# Patient Record
Sex: Male | Born: 1965
Health system: Southern US, Community
[De-identification: ages and names within clinical notes are randomized; demographics above are authoritative.]

## PROBLEM LIST (undated history)

## (undated) DIAGNOSIS — R7303 Prediabetes: Secondary | ICD-10-CM

## (undated) DIAGNOSIS — I10 Essential (primary) hypertension: Secondary | ICD-10-CM

## (undated) DIAGNOSIS — K219 Gastro-esophageal reflux disease without esophagitis: Secondary | ICD-10-CM

## (undated) DIAGNOSIS — R079 Chest pain, unspecified: Secondary | ICD-10-CM

## (undated) DIAGNOSIS — E785 Hyperlipidemia, unspecified: Secondary | ICD-10-CM

## (undated) DIAGNOSIS — E119 Type 2 diabetes mellitus without complications: Secondary | ICD-10-CM

## (undated) DIAGNOSIS — G4730 Sleep apnea, unspecified: Secondary | ICD-10-CM

## (undated) HISTORY — DX: Prediabetes: R73.03

## (undated) HISTORY — PX: FOOT SURGERY: SHX648

## (undated) HISTORY — PX: HERNIA REPAIR: SHX51

## (undated) HISTORY — PX: LIPOMA EXCISION: SHX5283

## (undated) HISTORY — PX: UMBILICAL HERNIA REPAIR: SHX196

## (undated) HISTORY — DX: Chest pain, unspecified: R07.9

## (undated) HISTORY — PX: CORRECTION HAMMER TOE: SUR317

---

## 1999-08-11 ENCOUNTER — Ambulatory Visit (HOSPITAL_COMMUNITY): Admission: RE | Admit: 1999-08-11 | Discharge: 1999-08-11 | Payer: Self-pay | Admitting: Emergency Medicine

## 2001-08-28 ENCOUNTER — Encounter: Admission: RE | Admit: 2001-08-28 | Discharge: 2001-08-28 | Payer: Self-pay | Admitting: Emergency Medicine

## 2001-08-28 ENCOUNTER — Encounter: Payer: Self-pay | Admitting: Emergency Medicine

## 2003-06-02 ENCOUNTER — Encounter: Admission: RE | Admit: 2003-06-02 | Discharge: 2003-06-02 | Payer: Self-pay | Admitting: Emergency Medicine

## 2003-07-22 ENCOUNTER — Ambulatory Visit (HOSPITAL_BASED_OUTPATIENT_CLINIC_OR_DEPARTMENT_OTHER): Admission: RE | Admit: 2003-07-22 | Discharge: 2003-07-22 | Payer: Self-pay

## 2003-07-22 ENCOUNTER — Ambulatory Visit (HOSPITAL_COMMUNITY): Admission: RE | Admit: 2003-07-22 | Discharge: 2003-07-22 | Payer: Self-pay

## 2003-10-22 ENCOUNTER — Emergency Department (HOSPITAL_COMMUNITY): Admission: EM | Admit: 2003-10-22 | Discharge: 2003-10-22 | Payer: Self-pay | Admitting: Family Medicine

## 2004-04-05 ENCOUNTER — Encounter: Admission: RE | Admit: 2004-04-05 | Discharge: 2004-04-05 | Payer: Self-pay | Admitting: Emergency Medicine

## 2005-06-05 ENCOUNTER — Encounter: Admission: RE | Admit: 2005-06-05 | Discharge: 2005-09-03 | Payer: Self-pay | Admitting: Emergency Medicine

## 2006-02-23 ENCOUNTER — Ambulatory Visit (HOSPITAL_COMMUNITY): Admission: RE | Admit: 2006-02-23 | Discharge: 2006-02-23 | Payer: Self-pay | Admitting: Cardiovascular Disease

## 2006-02-26 ENCOUNTER — Ambulatory Visit (HOSPITAL_COMMUNITY): Admission: RE | Admit: 2006-02-26 | Discharge: 2006-02-26 | Payer: Self-pay | Admitting: Cardiovascular Disease

## 2007-05-11 ENCOUNTER — Ambulatory Visit (HOSPITAL_COMMUNITY): Admission: RE | Admit: 2007-05-11 | Discharge: 2007-05-11 | Payer: Self-pay | Admitting: Emergency Medicine

## 2007-08-11 ENCOUNTER — Emergency Department (HOSPITAL_COMMUNITY): Admission: EM | Admit: 2007-08-11 | Discharge: 2007-08-11 | Payer: Self-pay | Admitting: Emergency Medicine

## 2007-09-13 ENCOUNTER — Emergency Department (HOSPITAL_COMMUNITY): Admission: EM | Admit: 2007-09-13 | Discharge: 2007-09-13 | Payer: Self-pay | Admitting: Emergency Medicine

## 2008-02-05 ENCOUNTER — Ambulatory Visit (HOSPITAL_COMMUNITY): Admission: RE | Admit: 2008-02-05 | Discharge: 2008-02-05 | Payer: Self-pay | Admitting: Emergency Medicine

## 2008-03-03 ENCOUNTER — Encounter: Admission: RE | Admit: 2008-03-03 | Discharge: 2008-04-09 | Payer: Self-pay | Admitting: Emergency Medicine

## 2008-03-10 ENCOUNTER — Ambulatory Visit: Payer: Self-pay | Admitting: Sports Medicine

## 2008-03-10 DIAGNOSIS — M2170 Unequal limb length (acquired), unspecified site: Secondary | ICD-10-CM | POA: Insufficient documentation

## 2008-03-10 DIAGNOSIS — M1711 Unilateral primary osteoarthritis, right knee: Secondary | ICD-10-CM | POA: Insufficient documentation

## 2008-03-10 DIAGNOSIS — M1710 Unilateral primary osteoarthritis, unspecified knee: Secondary | ICD-10-CM | POA: Insufficient documentation

## 2008-03-10 DIAGNOSIS — M25569 Pain in unspecified knee: Secondary | ICD-10-CM | POA: Insufficient documentation

## 2008-03-10 DIAGNOSIS — IMO0002 Reserved for concepts with insufficient information to code with codable children: Secondary | ICD-10-CM | POA: Insufficient documentation

## 2008-04-14 ENCOUNTER — Ambulatory Visit: Payer: Self-pay | Admitting: Sports Medicine

## 2008-05-01 DIAGNOSIS — G4730 Sleep apnea, unspecified: Secondary | ICD-10-CM

## 2008-05-01 HISTORY — PX: CARDIAC CATHETERIZATION: SHX172

## 2008-05-01 HISTORY — DX: Sleep apnea, unspecified: G47.30

## 2009-02-10 ENCOUNTER — Ambulatory Visit: Payer: Self-pay | Admitting: Sports Medicine

## 2009-02-10 DIAGNOSIS — M216X9 Other acquired deformities of unspecified foot: Secondary | ICD-10-CM | POA: Insufficient documentation

## 2009-02-10 DIAGNOSIS — M25579 Pain in unspecified ankle and joints of unspecified foot: Secondary | ICD-10-CM | POA: Insufficient documentation

## 2009-08-05 ENCOUNTER — Ambulatory Visit: Payer: Self-pay | Admitting: Sports Medicine

## 2009-08-05 DIAGNOSIS — M25529 Pain in unspecified elbow: Secondary | ICD-10-CM | POA: Insufficient documentation

## 2009-08-05 DIAGNOSIS — M25819 Other specified joint disorders, unspecified shoulder: Secondary | ICD-10-CM | POA: Insufficient documentation

## 2009-08-05 DIAGNOSIS — M7580 Other shoulder lesions, unspecified shoulder: Secondary | ICD-10-CM

## 2009-08-05 DIAGNOSIS — M7710 Lateral epicondylitis, unspecified elbow: Secondary | ICD-10-CM | POA: Insufficient documentation

## 2009-09-22 ENCOUNTER — Ambulatory Visit: Payer: Self-pay | Admitting: Sports Medicine

## 2009-10-07 ENCOUNTER — Encounter: Payer: Self-pay | Admitting: Sports Medicine

## 2009-10-07 ENCOUNTER — Encounter: Admission: RE | Admit: 2009-10-07 | Discharge: 2009-12-09 | Payer: Self-pay | Admitting: Sports Medicine

## 2009-11-06 ENCOUNTER — Encounter: Payer: Self-pay | Admitting: Sports Medicine

## 2009-12-28 ENCOUNTER — Ambulatory Visit (HOSPITAL_COMMUNITY): Admission: RE | Admit: 2009-12-28 | Discharge: 2009-12-28 | Payer: Self-pay | Admitting: Family Medicine

## 2010-05-22 ENCOUNTER — Encounter: Payer: Self-pay | Admitting: Emergency Medicine

## 2010-06-02 NOTE — Assessment & Plan Note (Signed)
Summary: F/U Kerry Nelson   Vital Signs:  Patient profile:   45 year old male BP sitting:   149 / 103  Vitals Entered By: Lillia Pauls CMA (Sep 22, 2009 9:37 AM)  History of Present Illness: injection done and helped for 2-3 weeks still has decrease shoulder pain compared to before hurts with lifting above head at times has only done some of exercises and not consistently  left elbow is much better strap helped a lot can'Kerry tell much pain with any of his activity  Physical Exam  General:  Well-developed,well-nourished,in no acute distress; alert,appropriate and cooperative throughout examination Msk:  full abductiona and elevation with left shoulder full rotation and motion of elbow on left  left shoulder internal rotation causes anterior pain  external rotation at waist level no pain  internal rotation waist level no pain  supraspinadus testing no pain at side  deltoid testing no pain  back scratch slightly tight on left - 1 vetebrae level weak on empty can  pain on hawkins  neg drop arm good strength thorughout except w impingement positions   Impression & Recommendations:  Problem # 1:  SHOULDER IMPINGEMENT SYNDROME (ICD-726.2) I think he needs a good course of PT as he is inconsistent with HEP  making progress but hopefully we can speed this  add scap stab exercises  refer to PT  ck p PT complete  Problem # 2:  LATERAL EPICONDYLITIS (ICD-726.32) much better  do wrist curls and rolls for 2 more mos  cont strap for 2 more mos  Problem # 3:  ELBOW PAIN, LEFT (ICD-719.42) Assessment: Improved This is resolved almost 100% w strap use  Complete Medication List: 1)  Simvastatin 40 Mg Tabs (Simvastatin) .Marland Kitchen.. 1 by mouth qhs 2)  Hyzaar 50-12.5 Mg Tabs (Losartan potassium-hctz) .Marland Kitchen.. 1 by mouth qd 3)  Nexium 40 Mg Cpdr (Esomeprazole magnesium) .Marland Kitchen.. 1 by mouth qhs 4)  Tramadol Hcl 50 Mg Tabs (Tramadol hcl) .Marland Kitchen.. 1 by mouth q6hrs

## 2010-06-02 NOTE — Letter (Signed)
Summary: [facility] PT referral form  [facility] PT referral form   Imported By: Marily Memos 09/22/2009 11:00:17  _____________________________________________________________________  External Attachment:    Type:   Image     Comment:   External Document

## 2010-06-02 NOTE — Assessment & Plan Note (Signed)
Summary: L SHOULDER/ELBOW PAIN,[facility]   Vital Signs:  Patient profile:   45 year old male BP sitting:   135 / 89  Vitals Entered By: Lillia Pauls CMA (August 05, 2009 3:55 PM)  History of Present Illness: Pt presents with left shoulder and elbow pain for the last 3 weeks since suppint dryer duct around his pipes under his home. He is right handed. He is now having lateral elbow pain but also occasionally gets medial pain with full elbow extension. In addition, his left shoulder has been painful for him as well, especially when he tries to do overhead activities, put his hand behind his back or lifting a gallon of milk. The pain can wake him up at night from sleep. He denies any numbness or tingling. The pain is focused in the joint region but he also has tightness in his deltoid and trapezius. He has not tried any medications, ice or heat for his symptoms. Only prior injury was in the early 2000s when he fell through the rafters at [employer] when he was employed there.   Physical Exam  General:  alert and well-developed.   Head:  normocephalic and atraumatic.   Neck:  supple.   Lungs:  normal respiratory effort.   Msk:  Left Shoulder: No bony abnormalities, edema or bruising + TTP over the biceps tendon Full forward flexion and abduction but pain with both movements between 90-110 degrees 5/5 strength with resisted internal and external rotation Neg Empty can  + Hawkin's, + Cross over, + Neer's, + Speed's  5/5 biceps, triceps and grip strength Can get his hand behind his back to L3 Neg Yergason's  Right Shoulder: Normal inspection without bony abnormalities, edema or bruising Full ROM without pain with forward flexion and abduction No TTP throughout Neg Neer's, Hawkin's, Cross over, Empty can and Speed's 5/5 strength with resisted internal and external rotation, 5/5 strength with resisted biceps, triceps and grip strength testing  Left Elbow: Normal inspection Full ROM in all  directions + TTP over the lateral epicondyle, mild TTP over the medial epicondyle Neg tap test over the ulnar groove 5/5 strength with all resisted ROM testing but pain with resisted pronation and wrist extension Pain with resisted 3rd finger extension  Right Elbow: Normal inspection Full ROM in all directions No TTP throughout Neg tap test over the ulnar groove 5/5 strength with all resisted ROM testing No pain with resisted wrist extension      Impression & Recommendations:  Problem # 1:  ELBOW PAIN, LEFT (ICD-719.42) Assessment New Due to lateral epicondylitis and overuse 1. Given a conterforce brace to wear at all times to help with vibration and overuse 2. Given a handout with multiple stretches and strengthening exercises to do daily 3. Ice for 20 minutes daily 4. Return in 4 weeks for follow-up  Orders: Pneumatic Arm Band (Z6109) Kenalog 10 mg inj (J3301)  Problem # 2:  LATERAL EPICONDYLITIS (ICD-726.32) Assessment: New See above for treatment plan  Problem # 3:  SHOULDER IMPINGEMENT SYNDROME (ICD-726.2) Assessment: New  1. Patient consented to have an injection performed for impingement syndrome Consent obtained and verified. Sterile betadine prep. Furthur cleansed with alcohol. Topical analgesic spray: Ethyl chloride. Joint:Left shoulder Approached in typical fashion: posterior shoulder Completed without difficulty Meds: 40mg  kenalog, 6 cc of 0.25% marcaine Needle:21 guage, 2 inch Aftercare instructions and Red flags advised. Patient tolerated the procedure well. No stressful activities with the left shoulder for 72 hours  2. Given a handout and  theraband to do home exercises with on a daily basis 3. Return in 4 weeks for follow-up  Orders: Joint Aspirate / Injection, Large (20610)  Complete Medication List: 1)  Simvastatin 40 Mg Tabs (Simvastatin) .Marland Kitchen.. 1 by mouth qhs 2)  Hyzaar 50-12.5 Mg Tabs (Losartan potassium-hctz) .Marland Kitchen.. 1 by mouth qd 3)   Nexium 40 Mg Cpdr (Esomeprazole magnesium) .Marland Kitchen.. 1 by mouth qhs 4)  Tramadol Hcl 50 Mg Tabs (Tramadol hcl) .Marland Kitchen.. 1 by mouth q6hrs  Patient Instructions: 1)  Please do the elbow stretches and exercises daily along with your shoulder exercises with the theraband. 2)  Avoid strenuous activities for the next 3 days with your left shoulder. 3)  Please come back in 4 weeks for follow-up. 4)  Wear the tennis elbow brace during the day.

## 2010-06-02 NOTE — Miscellaneous (Signed)
Summary: [facility]  [facility]   Imported By: Marily Memos 11/17/2009 10:16:42  _____________________________________________________________________  External Attachment:    Type:   Image     Comment:   External Document

## 2010-06-02 NOTE — Miscellaneous (Signed)
Summary: [facility]  [facility]   Imported By: Marily Memos 12/09/2009 14:31:26  _____________________________________________________________________  External Attachment:    Type:   Image     Comment:   External Document

## 2010-09-16 NOTE — Op Note (Signed)
NAME:  Kerry Nelson, Kerry Nelson                           ACCOUNT NO.:  [id number]   MEDICAL RECORD NO.:  [id number]                   PATIENT TYPE:  AMB   LOCATION:  DSC                                  FACILITY:  [facility]   PHYSICIAN:  Lorre Munroe., M.D.            DATE OF BIRTH:  12-10-1965   DATE OF PROCEDURE:  07/22/2003  DATE OF DISCHARGE:                                 OPERATIVE REPORT   PREOPERATIVE DIAGNOSES:  Umbilical hernia.   POSTOPERATIVE DIAGNOSES:  Umbilical hernia.   OPERATION:  Repair of umbilical hernia.   SURGEON:  Lebron Conners, M.D.   ANESTHESIA:  General and local.   DESCRIPTION OF PROCEDURE:  After the patient was monitored and anesthetized  and had routine preparation and draping of the abdomen, I made a downwardly  curved incision approximately 4 cm in length just above the umbilicus. I  dissected down through the normal subcutaneous tissues until I encountered  the contents of a hernia coming through the umbilical area and adherent to  the umbilical skin. I separated the contents of the hernia from the  surrounding normal subcutaneous tissues and from the umbilical skin and then  freed it up at the level of the fascia and reduced it under the fascia. It  appeared to be entirely preperitoneal fat.  I then mobilized the umbilical  skin and subcutaneous tissues for approximately 2 cm in all directions from  the hernia defect which measured approximately 1 1/2 cm.  I then fashioned a  small patch of polypropylene mesh and spread it out in the preperitoneal  space just underneath the fascia.  I then closed the hernia defect with  running 2-0 Prolene suture incorporating the mesh into the closure.  I felt  that the hernia was adequately repaired. There had been a small area of  abnormal appearing fat which I excised and explored down to the fascia to  make sure no epigastric hernia was present.  Hemostasis was good. I sutured  the umbilical skin down to the fascia  with a single stitch of 4-0 Vicryl to  reduce dead space. I then closed skin with intracuticular running 4-0 Vicryl  and Steri-Strips and applied a bulky bandage.  The patient was stable  throughout. I anesthetized the area with long acting local anesthetic prior  to closure. I applied a bulky slightly compressive bandage.                                               Lorre Munroe., M.D.    Jodi Marble  D:  07/22/2003  T:  07/23/2003  Job:  [id number]

## 2010-09-16 NOTE — Cardiovascular Report (Signed)
NAMEKAYNAN, KLONOWSKI NO.:  [id number]   MEDICAL RECORD NO.:  [id number]          PATIENT TYPE:  OIB   LOCATION:  2899                         FACILITY:  [facility]   PHYSICIAN:  Nanetta Batty, M.D.   DATE OF BIRTH:  1966/02/09   DATE OF PROCEDURE:  02/26/2006  DATE OF DISCHARGE:  02/26/2006                              CARDIAC CATHETERIZATION   Mr. Kerry Nelson was a 45 year old mildly overweight African American male with  positive risk factors, atypical chest pain and positive Myoview who presents  now for outpatient diagnostic coronary angiography to define his anatomy and  rule out ischemic etiology.   DESCRIPTION OF PROCEDURE:  The patient was brought to the second floor [facility] in the postabsorptive state.  His  right groin was prepped and shaved in the usual sterile fashion; 1%  Xylocaine was used for local anesthesia.  A 6-French sheath was inserted  into the right femoral artery using standard Seldinger technique.  A 6-  French right and left Judkins diagnostic catheters as well as [place] pigtail  catheter were used for selective coronary angiography and left  ventriculography, respectively.  Visipaque dye was used for the entirety of  the case.  Aortic, left ventricular, and left and right heart pressures were  recorded.   HEMODYNAMIC RESULTS:  Aortic systolic pressure 143, diastolic pressure 90,  left ventricular systolic pressure 141, end-diastolic pressure 26.   SELECTIVE CORONARY ANGIOGRAPHY:  1. Left main: There was no true left main.  There was a double-barrel      left main with separate ostium of the LAD and circumflex.  2. LAD: Widely patent.  3. Circumflex: Widely patent.  4. Right coronary:  was dominant and widely patent.   LEFT VENTRICULOGRAPHY:  RAO left ventriculogram was performed using 25 mL of  Visipaque dye at 12 mL per second.  The overall  LVEF was estimated greater  than 60% without focal wall  motion abnormalities.   IMPRESSION:  Mr. Kerry Nelson has essentially normal coronary arteries with double-  barrel left main and normal LV function.  I believe his chest pain is  noncardiac.  It is likely related to reflux or multifactorial.  Continued  medical therapy will be recommended including treatment of his sleep apnea  and hypertension.   Sheath was removed and pressure held on the groin to achieve hemostasis.  The patient left the lab in stable condition.Marland Kitchen  He will be discharged home  and followed as an outpatient. Will see back in the office in approximately  a week for follow-up.      Nanetta Batty, M.D.  Electronically Signed     JB/MEDQ  D:  02/26/2006  T:  02/26/2006  Job:  [id number]   cc:   2nd Floor [facility]  [facility]  [facility]  Reuben Likes, M.D.

## 2010-10-17 ENCOUNTER — Encounter: Payer: 59 | Attending: Physician Assistant

## 2010-11-08 ENCOUNTER — Encounter: Payer: 59 | Attending: Physician Assistant

## 2010-11-09 NOTE — Progress Notes (Signed)
  Patient was seen on 11/08/2010 for the second of a series of three diabetes self-management courses at [facility]. The following learning objectives were met by the patient during this course:   States the relationship of exercise to blood glucose  States benefits/barriers of regular and safe exercise  States three guidelines for safe and effective exercise  Describes personal diabetes medicine regimen  Describes actions of own medications  Describes causes, symptoms, and treatment of hypo/hyperglycemia  Describes sick day rules  Identifies when to test urine for ketones when appropriate  Identifies when to call healthcare provider for acute complications  States the risk for problems with foot, skin, and dental care  States preventative foot, skin, and dental care measures  States when to call healthcare provider regarding foot, skin, and dental care  Identifies methods for evaluation of diabetes control  Discusses benefits of SBGM  Identifies relationship between nutrition, exercise, medication, and glucose levels  Discusses the importance of record keeping  Follow-Up Plan: Patient will attend the final class of the ADA Diabetes Self-Care Education.  

## 2010-11-21 NOTE — Progress Notes (Signed)
  Patient was seen on 11/15/2010 for the third of a series of three diabetes self-management courses at the [facility]. The following learning objectives were met by the patient during this course:   Identifies nutrient effects on glycemia  States the general guidelines of meal planning  Relates understanding of personal meal plan  Describes situations that cause stress and discuss methods of stress management  Identifies lifestyle behaviors for change  Your patient has established the following 3 month goal for diabetes self-care:  Eat meals on time.  Follow-Up Plan: Patient will attend a 3 month follow-up visit for diabetes self-management education.

## 2012-07-18 ENCOUNTER — Other Ambulatory Visit (HOSPITAL_COMMUNITY): Payer: Self-pay | Admitting: Orthopaedic Surgery

## 2012-07-18 DIAGNOSIS — M25512 Pain in left shoulder: Secondary | ICD-10-CM

## 2012-07-22 ENCOUNTER — Ambulatory Visit (HOSPITAL_COMMUNITY): Payer: 59

## 2012-07-23 ENCOUNTER — Ambulatory Visit (HOSPITAL_COMMUNITY): Admission: RE | Admit: 2012-07-23 | Payer: 59 | Source: Ambulatory Visit

## 2012-07-24 ENCOUNTER — Ambulatory Visit (HOSPITAL_COMMUNITY)
Admission: RE | Admit: 2012-07-24 | Discharge: 2012-07-24 | Disposition: A | Discharge status: Home / Self Care | Payer: 59 | Source: Ambulatory Visit | Attending: Orthopaedic Surgery | Admitting: Orthopaedic Surgery

## 2012-07-24 DIAGNOSIS — M19019 Primary osteoarthritis, unspecified shoulder: Secondary | ICD-10-CM | POA: Insufficient documentation

## 2012-07-24 DIAGNOSIS — Y93B3 Activity, free weights: Secondary | ICD-10-CM | POA: Insufficient documentation

## 2012-07-24 DIAGNOSIS — M25519 Pain in unspecified shoulder: Secondary | ICD-10-CM | POA: Insufficient documentation

## 2012-07-24 DIAGNOSIS — M25419 Effusion, unspecified shoulder: Secondary | ICD-10-CM | POA: Insufficient documentation

## 2012-07-24 DIAGNOSIS — X58XXXA Exposure to other specified factors, initial encounter: Secondary | ICD-10-CM | POA: Insufficient documentation

## 2012-07-24 DIAGNOSIS — M25512 Pain in left shoulder: Secondary | ICD-10-CM

## 2012-09-09 ENCOUNTER — Encounter (HOSPITAL_COMMUNITY): Payer: Self-pay | Admitting: Pharmacy Technician

## 2012-09-10 ENCOUNTER — Telehealth: Payer: Self-pay | Admitting: Cardiovascular Disease

## 2012-09-10 ENCOUNTER — Encounter (HOSPITAL_COMMUNITY)
Admission: RE | Admit: 2012-09-10 | Discharge: 2012-09-10 | Disposition: A | Discharge status: Home / Self Care | Payer: 59 | Source: Ambulatory Visit | Attending: Orthopaedic Surgery | Admitting: Orthopaedic Surgery

## 2012-09-10 ENCOUNTER — Encounter (HOSPITAL_COMMUNITY)
Admission: RE | Admit: 2012-09-10 | Discharge: 2012-09-10 | Disposition: A | Discharge status: Home / Self Care | Payer: 59 | Source: Ambulatory Visit | Attending: Anesthesiology | Admitting: Anesthesiology

## 2012-09-10 ENCOUNTER — Encounter (HOSPITAL_COMMUNITY): Payer: Self-pay

## 2012-09-10 HISTORY — DX: Hyperlipidemia, unspecified: E78.5

## 2012-09-10 HISTORY — DX: Sleep apnea, unspecified: G47.30

## 2012-09-10 HISTORY — DX: Gastro-esophageal reflux disease without esophagitis: K21.9

## 2012-09-10 HISTORY — DX: Essential (primary) hypertension: I10

## 2012-09-10 LAB — BASIC METABOLIC PANEL
BUN: 10 mg/dL (ref 6–23)
CO2: 29 mEq/L (ref 19–32)
Creatinine, Ser: 1.05 mg/dL (ref 0.50–1.35)
GFR calc Af Amer: >90 mL/min (ref >90)
Glucose, Bld: 99 mg/dL (ref 70–99)
Sodium: 139 mEq/L (ref 135–145)

## 2012-09-10 LAB — CBC
HCT: 39.7 % (ref 39.0–52.0)
Hemoglobin: 13.3 g/dL (ref 13.0–17.0)
RBC: 4.61 MIL/uL (ref 4.22–5.81)
WBC: 8.6 10*3/uL (ref 4.0–10.5)

## 2012-09-10 LAB — SURGICAL PCR SCREEN: MRSA, PCR: NEGATIVE

## 2012-09-10 NOTE — Telephone Encounter (Signed)
Call from [facility] requesting a stress test and sleep study for the patient.  The patient has not been seen here since 2008, so the chart would have to be requested from [facility].  I faxed this information to [facility] at [phone].

## 2012-09-10 NOTE — Progress Notes (Signed)
No orders for PAT appt . Called office for orders.

## 2012-09-10 NOTE — Pre-Procedure Instructions (Signed)
Kerry Nelson  09/10/2012   Your procedure is scheduled on:  Tuesday, May 20  Report to [facility] at 0530 AM.  Call this number if you have problems the morning of surgery: [phone]   Remember:   Do not eat food or drink liquids after midnight.Monday night   Take these medicines the morning of surgery with A SIP OF WATER: None   Do not wear jewelry, make-up or nail polish.  Do not wear lotions, powders, or perfumes. You may wear deodorant.  Do not shave 48 hours prior to surgery. Men may shave face and neck.  Do not bring valuables to the hospital.  Contacts, dentures or bridgework may not be worn into surgery.     Patients discharged the day of surgery will not be allowed to drive  home.  Name and phone number of your driver:     Special Instructions: Shower using CHG 2 nights before surgery and the night before surgery.  If you shower the day of surgery use CHG.  Use special wash - you have one bottle of CHG for all showers.  You should use approximately 1/3 of the bottle for each shower.   Please read over the following fact sheets that you were given: Pain Booklet, Coughing and Deep Breathing and Surgical Site Infection Prevention

## 2012-09-11 NOTE — H&P (Signed)
Norlene Campbell, MD   Jacqualine Code, PA-C [address] [place], [place]  [address]                             [phone]   ORTHOPAEDIC HISTORY & PHYSICAL  PASCAL STIGGERS MRN:  [id number] DOB/SEX:  1965-05-25/male  CHIEF COMPLAINT:  Painful left shoulder  HISTORY: Bilbo is an extremely pleasant 47 year old African American male who is seen today for evaluation of his left shoulder pain.  I last saw him several weeks ago and he had a 46-month history without any injury or trauma to the left shoulder.  It had been worsening and he is having difficulty with reaching overhead and cross chest maneuvers.  It is constantly painful and difficulty with sleeping.  He likes to roll onto his left arm when sleeping.  His pain has been moderate-to-severe.  We did try corticosteroid injection which did not have full benefit and since it only lasted for several days, we chose to obtain MRI scan looking for cuff tear.  MRI scan reveals marked inflammation and degeneration of the left acromioclavicular joint.  There is prominent bone and soft tissue that surrounds the acromioclavicular area with edema.  Otherwise, he has normal MRI with normal cuff.  However, he does have a type 3 acromion.  Failed conservative treatment and now for surgical intervention.   PAST MEDICAL HISTORY: Patient Active Problem List   Diagnosis Date Noted  . ELBOW PAIN, LEFT 08/05/2009  . SHOULDER IMPINGEMENT SYNDROME 08/05/2009  . LATERAL EPICONDYLITIS 08/05/2009  . ANKLE PAIN, LEFT 02/10/2009  . OTHER ACQUIRED DEFORMITY OF ANKLE AND FOOT OTHER 02/10/2009  . OSTEOARTHRITIS, KNEE, RIGHT 03/10/2008  . KNEE PAIN 03/10/2008  . UNEQUAL LEG LENGTH 03/10/2008   Past Medical History  Diagnosis Date  . GERD (gastroesophageal reflux disease)   . Hyperlipidemia   . Hypertension   . Sleep apnea 2010    does not use Cpap   Past Surgical History  Procedure Laterality Date  . Correction hammer toe Left   . Umbilical  hernia repair    . Lipoma excision      x2  . Foot surgery      bone spur removal  . Hernia repair    . Cardiac catheterization  2010    Dr Allyson Sabal      MEDICATIONS:   Prescriptions prior to admission  Medication Sig Dispense Refill  . esomeprazole (NEXIUM) 40 MG capsule Take 40 mg by mouth daily before breakfast.      . ezetimibe (ZETIA) 10 MG tablet Take 10 mg by mouth daily.      Marland Kitchen ibuprofen (ADVIL,MOTRIN) 200 MG tablet Take 400-600 mg by mouth every 6 (six) hours as needed for pain.      Marland Kitchen losartan-hydrochlorothiazide (HYZAAR) 100-25 MG per tablet Take 1 tablet by mouth daily.      . rosuvastatin (CRESTOR) 40 MG tablet Take 40 mg by mouth daily.        ALLERGIES:  No Known Allergies  REVIEW OF SYSTEMS:  A comprehensive review of systems was negative except for: Cardiovascular: positive for hypertension Endocrine: positive for diabetes Has sleep apnea   FAMILY HISTORY:  No family history on file.  SOCIAL HISTORY:   History  Substance Use Topics  . Smoking status: Current Every Day Smoker -- 0.50 packs/day for 25 years    Types: Cigarettes  . Smokeless tobacco: Never Used  . Alcohol  Use: 3.6 oz/week    6 Cans of beer per week      EXAMINATION: Vital signs in last 24 hours: Temp:  [98.5 F (36.9 C)] 98.5 F (36.9 C) (05/20 0850) Pulse Rate:  [82] 82 (05/20 0850) Resp:  [20] 20 (05/20 0850) BP: (127)/(88) 127/88 mmHg (05/20 0850) SpO2:  [98 %] 98 % (05/20 0850)  Head is normocephalic.   Eyes:  Pupils equal, round and reactive to light and accommodation.  Extraocular intact. ENT: Ears, nose, and throat were benign.   Neck: supple, no bruits were noted.   Chest: good expansion.   Lungs: essentially clear.   Cardiac: regular rhythm and rate, normal S1, S2.  No murmurs appreciated. Pulses :  1+ bilateral and symmetric in lower extremities. Abdomen is scaphoid, soft, nontender, no masses palpable, normal bowel sounds present. CNS:  He is oriented x3 and cranial  nerves II-XII grossly intact. Breast, rectal, and genital exams: not performed and not indicated for an orthopedic evaluation. Musculoskeletal:   Today, he has about 150 degrees of abduction and forward flexion with pain.  Arm at 90 degrees he has 70 degrees of external rotation, 70 degrees of internal rotation.  He does have empty can testing.  He is tender over the [facility] joint but this is more in the front of the shoulder at the [facility] area then over the [facility] per se.   12 Imaging Review X-rays from the office does reveal OA of the [facility] joint spurring and distal clavicle osteolysis and type 2 acromion  ASSESSMENT: Impingement syndrome left shoulder with ACOA.  Past Medical History  Diagnosis Date  . GERD (gastroesophageal reflux disease)   . Hyperlipidemia   . Hypertension   . Sleep apnea 2010    does not use Cpap    PLAN: Plan for left arthroscopic shoulder surgery, SAD, DCR.  PETRARCA,BRIAN 09/17/2012, 10:11 AM

## 2012-09-16 MED ORDER — SODIUM CHLORIDE 0.9 % IV SOLN: INTRAVENOUS | Status: DC

## 2012-09-16 MED ORDER — CHLORHEXIDINE GLUCONATE 4 % EX LIQD: 60.0000 mL | Freq: Once | CUTANEOUS | Status: DC

## 2012-09-16 MED ORDER — DEXTROSE 5 % IV SOLN
3.0000 g | INTRAVENOUS | Status: AC
  Administered 2012-09-17: 3 g via INTRAVENOUS
  Filled 2012-09-16 (×2): qty 3000

## 2012-09-16 MED ORDER — ACETAMINOPHEN 10 MG/ML IV SOLN
1000.0000 mg | Freq: Once | INTRAVENOUS | Status: AC
  Administered 2012-09-17: 1000 mg via INTRAVENOUS
  Filled 2012-09-16: qty 100

## 2012-09-17 ENCOUNTER — Encounter (HOSPITAL_COMMUNITY): Payer: Self-pay | Admitting: Certified Registered"

## 2012-09-17 ENCOUNTER — Ambulatory Visit (HOSPITAL_COMMUNITY): Payer: 59 | Admitting: Certified Registered"

## 2012-09-17 ENCOUNTER — Ambulatory Visit (HOSPITAL_COMMUNITY)
Admission: RE | Admit: 2012-09-17 | Discharge: 2012-09-17 | Disposition: A | Discharge status: Home / Self Care | Payer: 59 | Source: Ambulatory Visit | Attending: Orthopaedic Surgery | Admitting: Orthopaedic Surgery

## 2012-09-17 ENCOUNTER — Encounter (HOSPITAL_COMMUNITY)
Admission: RE | Disposition: A | Discharge status: Home / Self Care | Payer: Self-pay | Source: Ambulatory Visit | Attending: Orthopaedic Surgery

## 2012-09-17 DIAGNOSIS — K219 Gastro-esophageal reflux disease without esophagitis: Secondary | ICD-10-CM | POA: Insufficient documentation

## 2012-09-17 DIAGNOSIS — E785 Hyperlipidemia, unspecified: Secondary | ICD-10-CM | POA: Insufficient documentation

## 2012-09-17 DIAGNOSIS — Z79899 Other long term (current) drug therapy: Secondary | ICD-10-CM | POA: Insufficient documentation

## 2012-09-17 DIAGNOSIS — M7542 Impingement syndrome of left shoulder: Secondary | ICD-10-CM

## 2012-09-17 DIAGNOSIS — G4730 Sleep apnea, unspecified: Secondary | ICD-10-CM | POA: Insufficient documentation

## 2012-09-17 DIAGNOSIS — M25819 Other specified joint disorders, unspecified shoulder: Secondary | ICD-10-CM | POA: Insufficient documentation

## 2012-09-17 DIAGNOSIS — M1710 Unilateral primary osteoarthritis, unspecified knee: Secondary | ICD-10-CM | POA: Insufficient documentation

## 2012-09-17 DIAGNOSIS — Z6841 Body Mass Index (BMI) 40.0 and over, adult: Secondary | ICD-10-CM | POA: Insufficient documentation

## 2012-09-17 DIAGNOSIS — M19019 Primary osteoarthritis, unspecified shoulder: Secondary | ICD-10-CM | POA: Insufficient documentation

## 2012-09-17 DIAGNOSIS — M2170 Unequal limb length (acquired), unspecified site: Secondary | ICD-10-CM | POA: Insufficient documentation

## 2012-09-17 DIAGNOSIS — IMO0002 Reserved for concepts with insufficient information to code with codable children: Secondary | ICD-10-CM | POA: Insufficient documentation

## 2012-09-17 DIAGNOSIS — I10 Essential (primary) hypertension: Secondary | ICD-10-CM | POA: Insufficient documentation

## 2012-09-17 DIAGNOSIS — F17200 Nicotine dependence, unspecified, uncomplicated: Secondary | ICD-10-CM | POA: Insufficient documentation

## 2012-09-17 HISTORY — PX: SHOULDER ARTHROSCOPY WITH DISTAL CLAVICLE RESECTION: SHX5675

## 2012-09-17 SURGERY — SHOULDER ARTHROSCOPY WITH DISTAL CLAVICLE RESECTION
Anesthesia: Regional | Site: Shoulder | Laterality: Left | Wound class: Clean

## 2012-09-17 MED ORDER — BUPIVACAINE HCL (PF) 0.25 % IJ SOLN
INTRAMUSCULAR | Status: AC
  Filled 2012-09-17: qty 10

## 2012-09-17 MED ORDER — PROPOFOL 10 MG/ML IV BOLUS
INTRAVENOUS | Status: DC | PRN
  Administered 2012-09-17: 180 mg via INTRAVENOUS

## 2012-09-17 MED ORDER — OXYCODONE-ACETAMINOPHEN 5-325 MG PO TABS: 1.0000 | ORAL_TABLET | ORAL | Status: DC | PRN

## 2012-09-17 MED ORDER — NEOSTIGMINE METHYLSULFATE 1 MG/ML IJ SOLN
INTRAMUSCULAR | Status: DC | PRN
  Administered 2012-09-17: 3 mg via INTRAVENOUS

## 2012-09-17 MED ORDER — FENTANYL CITRATE 0.05 MG/ML IJ SOLN
INTRAMUSCULAR | Status: AC
  Filled 2012-09-17: qty 2

## 2012-09-17 MED ORDER — FENTANYL CITRATE 0.05 MG/ML IJ SOLN
INTRAMUSCULAR | Status: DC | PRN
  Administered 2012-09-17: 50 ug via INTRAVENOUS
  Administered 2012-09-17: 100 ug via INTRAVENOUS

## 2012-09-17 MED ORDER — BUPIVACAINE HCL (PF) 0.25 % IJ SOLN
INTRAMUSCULAR | Status: AC
  Filled 2012-09-17: qty 30

## 2012-09-17 MED ORDER — CEFAZOLIN SODIUM 1-5 GM-% IV SOLN
INTRAVENOUS | Status: AC
  Filled 2012-09-17: qty 50

## 2012-09-17 MED ORDER — CEFAZOLIN SODIUM-DEXTROSE 2-3 GM-% IV SOLR
INTRAVENOUS | Status: AC
  Filled 2012-09-17: qty 50

## 2012-09-17 MED ORDER — MIDAZOLAM HCL 2 MG/2ML IJ SOLN
INTRAMUSCULAR | Status: AC
  Administered 2012-09-17: 1 mg via INTRAVENOUS
  Filled 2012-09-17: qty 2

## 2012-09-17 MED ORDER — SODIUM CHLORIDE 0.9 % IV SOLN
10.0000 mg | INTRAVENOUS | Status: DC | PRN
  Administered 2012-09-17: 50 ug/min via INTRAVENOUS

## 2012-09-17 MED ORDER — LIDOCAINE-EPINEPHRINE (PF) 1 %-1:200000 IJ SOLN
INTRAMUSCULAR | Status: DC | PRN
  Administered 2012-09-17: 1 mL

## 2012-09-17 MED ORDER — LIDOCAINE HCL (CARDIAC) 20 MG/ML IV SOLN
INTRAVENOUS | Status: DC | PRN
  Administered 2012-09-17: 70 mg via INTRAVENOUS

## 2012-09-17 MED ORDER — ROCURONIUM BROMIDE 100 MG/10ML IV SOLN
INTRAVENOUS | Status: DC | PRN
  Administered 2012-09-17: 50 mg via INTRAVENOUS

## 2012-09-17 MED ORDER — OXYCODONE HCL 5 MG/5ML PO SOLN: 5.0000 mg | Freq: Once | ORAL | Status: DC | PRN

## 2012-09-17 MED ORDER — FENTANYL CITRATE 0.05 MG/ML IJ SOLN
50.0000 ug | INTRAMUSCULAR | Status: AC | PRN
  Administered 2012-09-17 (×2): 50 ug via INTRAVENOUS

## 2012-09-17 MED ORDER — LIDOCAINE-EPINEPHRINE (PF) 1 %-1:200000 IJ SOLN
INTRAMUSCULAR | Status: AC
  Filled 2012-09-17: qty 10

## 2012-09-17 MED ORDER — ACETAMINOPHEN 10 MG/ML IV SOLN
INTRAVENOUS | Status: AC
  Filled 2012-09-17: qty 100

## 2012-09-17 MED ORDER — HYDROMORPHONE HCL PF 1 MG/ML IJ SOLN: 0.2500 mg | INTRAMUSCULAR | Status: DC | PRN

## 2012-09-17 MED ORDER — BUPIVACAINE HCL (PF) 0.25 % IJ SOLN
INTRAMUSCULAR | Status: DC | PRN
  Administered 2012-09-17: 1 mL

## 2012-09-17 MED ORDER — ONDANSETRON HCL 4 MG/2ML IJ SOLN
INTRAMUSCULAR | Status: DC | PRN
  Administered 2012-09-17: 4 mg via INTRAVENOUS

## 2012-09-17 MED ORDER — PHENYLEPHRINE HCL 10 MG/ML IJ SOLN
INTRAMUSCULAR | Status: DC | PRN
  Administered 2012-09-17 (×5): 80 ug via INTRAVENOUS

## 2012-09-17 MED ORDER — OXYCODONE HCL 5 MG PO TABS: 5.0000 mg | ORAL_TABLET | Freq: Once | ORAL | Status: DC | PRN

## 2012-09-17 MED ORDER — SODIUM CHLORIDE 0.9 % IR SOLN
Status: DC | PRN
  Administered 2012-09-17: 12000 mL

## 2012-09-17 MED ORDER — LACTATED RINGERS IV SOLN
INTRAVENOUS | Status: DC
  Administered 2012-09-17 (×2): via INTRAVENOUS

## 2012-09-17 MED ORDER — GLYCOPYRROLATE 0.2 MG/ML IJ SOLN
INTRAMUSCULAR | Status: DC | PRN
  Administered 2012-09-17: 0.4 mg via INTRAVENOUS

## 2012-09-17 MED ORDER — MIDAZOLAM HCL 2 MG/2ML IJ SOLN
1.0000 mg | INTRAMUSCULAR | Status: AC | PRN
  Administered 2012-09-17: 1 mg via INTRAVENOUS

## 2012-09-17 MED ORDER — BUPIVACAINE-EPINEPHRINE PF 0.5-1:200000 % IJ SOLN
INTRAMUSCULAR | Status: DC | PRN
  Administered 2012-09-17: 30 mL

## 2012-09-17 MED ORDER — ONDANSETRON HCL 4 MG/2ML IJ SOLN: 4.0000 mg | Freq: Four times a day (QID) | INTRAMUSCULAR | Status: DC | PRN

## 2012-09-17 SURGICAL SUPPLY — 37 items
BLADE GREAT WHITE 4.2 (BLADE) ×1 IMPLANT
BUR OVAL 4.0 (BURR) ×1 IMPLANT
BUR OVAL 6.0 (BURR) ×2 IMPLANT
CANNULA ACUFLEX KIT 5X76 (CANNULA) ×2 IMPLANT
CLOTH BEACON ORANGE TIMEOUT ST (SAFETY) ×2 IMPLANT
DRAPE SHOULDER BEACH CHAIR (DRAPES) ×2 IMPLANT
DRAPE STERI 35X30 U-POUCH (DRAPES) ×1 IMPLANT
DRSG EMULSION OIL 3X3 NADH (GAUZE/BANDAGES/DRESSINGS) ×2 IMPLANT
DRSG PAD ABDOMINAL 8X10 ST (GAUZE/BANDAGES/DRESSINGS) ×2 IMPLANT
DURAPREP 26ML APPLICATOR (WOUND CARE) ×2 IMPLANT
ELECT MENISCUS 165MM 90D (ELECTRODE) IMPLANT
GLOVE BIOGEL PI IND STRL 8 (GLOVE) ×1 IMPLANT
GLOVE BIOGEL PI IND STRL 8.5 (GLOVE) IMPLANT
GLOVE BIOGEL PI INDICATOR 8 (GLOVE) ×1
GLOVE BIOGEL PI INDICATOR 8.5 (GLOVE) ×1
GLOVE ECLIPSE 8.0 STRL XLNG CF (GLOVE) ×2 IMPLANT
GLOVE SURG ORTHO 8.5 STRL (GLOVE) ×1 IMPLANT
GOWN PREVENTION PLUS XLARGE (GOWN DISPOSABLE) ×2 IMPLANT
GOWN STRL NON-REIN LRG LVL3 (GOWN DISPOSABLE) ×4 IMPLANT
KIT ROOM TURNOVER OR (KITS) ×2 IMPLANT
MANIFOLD NEPTUNE II (INSTRUMENTS) ×2 IMPLANT
NDL SPNL 18GX3.5 QUINCKE PK (NEEDLE) ×1 IMPLANT
NEEDLE 22X1 1/2 (OR ONLY) (NEEDLE) ×2 IMPLANT
NEEDLE SPNL 18GX3.5 QUINCKE PK (NEEDLE) ×2 IMPLANT
NS IRRIG 1000ML POUR BTL (IV SOLUTION) ×2 IMPLANT
PACK SHOULDER (CUSTOM PROCEDURE TRAY) ×2 IMPLANT
PAD ARMBOARD 7.5X6 YLW CONV (MISCELLANEOUS) ×4 IMPLANT
SET ARTHROSCOPY TUBING (MISCELLANEOUS) ×2
SET ARTHROSCOPY TUBING LN (MISCELLANEOUS) ×1 IMPLANT
SPONGE GAUZE 4X4 12PLY (GAUZE/BANDAGES/DRESSINGS) ×2 IMPLANT
SUT ETHILON 4 0 PS 2 18 (SUTURE) ×2 IMPLANT
SYR 20ML ECCENTRIC (SYRINGE) ×2 IMPLANT
SYR CONTROL 10ML LL (SYRINGE) ×2 IMPLANT
TOWEL OR 17X24 6PK STRL BLUE (TOWEL DISPOSABLE) ×2 IMPLANT
TOWEL OR 17X26 10 PK STRL BLUE (TOWEL DISPOSABLE) ×2 IMPLANT
WAND 90 DEG TURBOVAC W/CORD (SURGICAL WAND) ×2 IMPLANT
WATER STERILE IRR 1000ML POUR (IV SOLUTION) ×2 IMPLANT

## 2012-09-17 NOTE — Transfer of Care (Signed)
Immediate Anesthesia Transfer of Care Note  Patient: Kerry Nelson  Procedure(s) Performed: Procedure(s) with comments: SHOULDER ARTHROSCOPY WITH DISTAL CLAVICLE RESECTION (Left) - Left shoulder arthroscopy, subacromial decompression, distal clavicle resection.  Patient Location: PACU  Anesthesia Type:GA combined with regional for post-op pain  Level of Consciousness: awake, alert  and oriented  Airway & Oxygen Therapy: Patient Spontanous Breathing and Patient connected to nasal cannula oxygen  Post-op Assessment: Report given to PACU RN, Post -op Vital signs reviewed and stable and Patient moving all extremities  Post vital signs: Reviewed and stable  Complications: No apparent anesthesia complications

## 2012-09-17 NOTE — Preoperative (Signed)
Beta Blockers   Reason not to administer Beta Blockers:Not Applicable 

## 2012-09-17 NOTE — Anesthesia Postprocedure Evaluation (Signed)
Anesthesia Post Note  Patient: Kerry Nelson  Procedure(s) Performed: Procedure(s) (LRB): SHOULDER ARTHROSCOPY WITH DISTAL CLAVICLE RESECTION (Left)  Anesthesia type: General  Patient location: PACU  Post pain: Pain level controlled and Adequate analgesia  Post assessment: Post-op Vital signs reviewed, Patient's Cardiovascular Status Stable, Respiratory Function Stable, Patent Airway and Pain level controlled  Last Vitals:  Filed Vitals:   09/17/12 1300  BP:   Pulse: 84  Temp: 36.1 C  Resp: 20    Post vital signs: Reviewed and stable  Level of consciousness: awake, alert  and oriented  Complications: No apparent anesthesia complications

## 2012-09-17 NOTE — H&P (Signed)
  The recent History & Physical has been reviewed. I have personally examined the patient today. There is no interval change to the documented History & Physical. The patient would like to proceed with the procedure.  Norlene Campbell W 09/17/2012,  10:02 AM  The recent History & Physical has been reviewed. I have personally examined the patient today. There is no interval change to the documented History & Physical. The patient would like to proceed with the procedure.  Norlene Campbell W 09/17/2012,  10:02 AM

## 2012-09-17 NOTE — Anesthesia Preprocedure Evaluation (Signed)
Anesthesia Evaluation  Patient identified by MRN, date of birth, ID band Patient awake    Reviewed: Allergy & Precautions, H&P , NPO status , Patient's Chart, lab work & pertinent test results  Airway Mallampati: II  Neck ROM: full    Dental   Pulmonary sleep apnea , Current Smoker,          Cardiovascular hypertension,     Neuro/Psych    GI/Hepatic GERD-  ,  Endo/Other  Morbid obesity  Renal/GU      Musculoskeletal   Abdominal   Peds  Hematology   Anesthesia Other Findings   Reproductive/Obstetrics                           Anesthesia Physical Anesthesia Plan  ASA: II  Anesthesia Plan: General and Regional   Post-op Pain Management: MAC Combined w/ Regional for Post-op pain   Induction: Intravenous  Airway Management Planned: Oral ETT  Additional Equipment:   Intra-op Plan:   Post-operative Plan: Extubation in OR  Informed Consent: I have reviewed the patients History and Physical, chart, labs and discussed the procedure including the risks, benefits and alternatives for the proposed anesthesia with the patient or authorized representative who has indicated his/her understanding and acceptance.     Plan Discussed with: CRNA, Anesthesiologist and Surgeon  Anesthesia Plan Comments:         Anesthesia Quick Evaluation

## 2012-09-17 NOTE — Op Note (Signed)
PATIENT ID:      Kerry Nelson  MRN:     [id number] DOB/AGE:    [birth year] / 46 y.o.       OPERATIVE REPORT    DATE OF PROCEDURE:  09/17/2012       PREOPERATIVE DIAGNOSIS:   Left shoulder impingement with DJD a-c joint                                                       Estimated body mass index is 41.09 kg/(m^2) as calculated from the following:   Height as of 09/10/12: 6' (1.829 m).   Weight as of 02/10/09: 137.44 kg (303 lb).     POSTOPERATIVE DIAGNOSIS:   same                                                                Estimated body mass index is 41.09 kg/(m^2) as calculated from the following:   Height as of 09/10/12: 6' (1.829 m).   Weight as of 02/10/09: 137.44 kg (303 lb).     PROCEDURE:  Procedure(s):DIAGNOSTIC ARTHROSCOPY LEFT SHOULDER WITH SUBACROMIAL DECOMPRESSION AND DISTAL CLAVICLE RESECTION     SURGEON:  Norlene Campbell, MD    ASSISTANT:   Jacqualine Code, PA-C   (Present and scrubbed throughout the case, critical for assistance with exposure, retraction, instrumentation, and closure.)          ANESTHESIA: regional and general     DRAINS: none :      TOURNIQUET TIME: * No tourniquets in log *    COMPLICATIONS:  None   CONDITION:  stable  PROCEDURE IN DETAIL; [id number]   Kerry Nelson W 09/17/2012, 11:51 AM

## 2012-09-17 NOTE — Anesthesia Procedure Notes (Addendum)
Procedure Name: Intubation Date/Time: 09/17/2012 10:44 AM Performed by: Charm Barges, DAVID R Pre-anesthesia Checklist: Patient identified, Emergency Drugs available, Suction available, Patient being monitored and Timeout performed Patient Re-evaluated:Patient Re-evaluated prior to inductionOxygen Delivery Method: Circle system utilized Intubation Type: IV induction Ventilation: Mask ventilation without difficulty Laryngoscope Size: Mac and 4 Grade View: Grade III Tube type: Oral Tube size: 8.0 mm Number of attempts: 2 (First attempt into esophagus, ETT out and mask ventilation, successful intubation) Airway Equipment and Method: Stylet Placement Confirmation: ETT inserted through vocal cords under direct vision and positive ETCO2 Secured at: 24 cm Tube secured with: Tape Dental Injury: Bloody posterior oropharynx    Anesthesia Regional Block:  Interscalene brachial plexus block  Pre-Anesthetic Checklist: ,, timeout performed, Correct Patient, Correct Site, Correct Laterality, Correct Procedure, Correct Position, site marked, Risks and benefits discussed,  Surgical consent,  Pre-op evaluation,  At surgeon's request and post-op pain management  Laterality: Left  Prep: chloraprep       Needles:  Injection technique: Single-shot  Needle Type: Echogenic Stimulator Needle     Needle Length: 5cm 5 cm Needle Gauge: 22 and 22 G    Additional Needles:  Procedures: ultrasound guided (picture in chart) and nerve stimulator Interscalene brachial plexus block  Nerve Stimulator or Paresthesia:  Response: biceps flexion, 0.45 mA,   Additional Responses:   Narrative:  Start time: 09/17/2012 9:57 AM End time: 09/17/2012 10:11 AM Injection made incrementally with aspirations every 5 mL.  Performed by: Personally  Anesthesiologist: Dr Chaney Malling  Additional Notes: Functioning IV was confirmed and monitors were applied.  A 50mm 22ga Arrow echogenic stimulator needle was used. Sterile prep  and drape,hand hygiene and sterile gloves were used.  Negative aspiration and negative test dose prior to incremental administration of local anesthetic. The patient tolerated the procedure well.  Ultrasound guidance: relevent anatomy identified, needle position confirmed, local anesthetic spread visualized around nerve(s), vascular puncture avoided.  Image printed for medical record.   Interscalene brachial plexus block

## 2012-09-18 ENCOUNTER — Encounter (HOSPITAL_COMMUNITY): Payer: Self-pay | Admitting: Orthopaedic Surgery

## 2012-09-18 NOTE — Op Note (Signed)
NAMEMAHMOUD, Kerry Nelson                 ACCOUNT NO.:  [id number]  MEDICAL RECORD NO.:  [id number]  LOCATION:  [facility]                         FACILITY:  [facility]  PHYSICIAN:  Claude Manges. Whitfield, M.D.DATE OF BIRTH:  1966/02/08  DATE OF PROCEDURE:  09/17/2012 DATE OF DISCHARGE:  09/17/2012                              OPERATIVE REPORT   PREOPERATIVE DIAGNOSIS:  Impingement left shoulder with degenerative joint disease, acromioclavicular joint.  POSTOPERATIVE DIAGNOSIS:  Impingement left shoulder with degenerative joint disease, acromioclavicular joint.  PROCEDURE: 1. Diagnostic arthroscopy, left shoulder. 2. Arthroscopic subacromial decompression. 3. Arthroscopic distal clavicle resection.  SURGEON:  Claude Manges. Cleophas Dunker, MD  ASSISTANT:  Oris Drone. Petrarca, PA-C  ANESTHESIA:  General with supplemental interscalene nerve block.  COMPLICATIONS:  None.  HISTORY:  A 47 year old [employer] employee, has had progressive pain in his left shoulder, without history of injury or trauma.  He has evidence of impingement syndrome by exam and has not responded to cortisone injections at time and anti-inflammatory medicine.  He has had an MRI scan revealing a type 3 acromion and considerable degenerative change at the [facility] joint with edema on both sides of the joint.  He now wishes to proceed with an arthroscopic debridement.  PROCEDURE IN DETAIL:  Mr. Butch was met in the holding area, they identified the left shoulder as appropriate operative site and signed the shoulder.  He did receive a preoperative interscalene nerve block by Anesthesia. The patient was then transported to room #7 and placed under general anesthesia without difficulty.  He was placed in a semi-sitting position with the shoulder frame.  Examination of the left shoulder revealed no evidence of instability or adhesive capsulitis.  The left shoulder was then prepped with DuraPrep from the base of the neck circumferentially below  the elbow.  Sterile draping was performed.  Marking pen was used to outline the [facility] joint, the coracoid and acromion.  At a point of fingerbreadth, posteromedial to the posterior angle acromion, a small stab wound was made and the arthroscope easily placed into the shoulder joint.  Diagnostic arthroscopy revealed no evidence of chondromalacia of the humeral head or chromium or glenoid.  There were no loose bodies, I did not see any synovitis.  There was no pathology about the biceps tendon.  The rotator cuff was clear.  The labrum was clear.  The arthroscope was then placed in the subacromial space posteriorly. Anterior and a third portal established in the lateral subacromial space.  Diagnostic arthroscopy revealed moderate amount of inflammatory bursal tissue, this was removed with the ArthroCare wand.  There was obvious anterior and lateral overhang of the acromion.  I performed an anterior and lateral acromioplasty with a 6-mm bur with what appeared to be clinically nice flat resection and there was no evidence of impingement.  I did not see any evidence of a cuff tear.  [facility], The joint had reactive synovitis about the joint, was considerably enlarged and the capsule was minimal with the distal clavicle exposed, I performed a distal clavicle resection with the same 6 mm bur and a nice flat resection, did not feel any osteophyte formation superiorly and all the synovitis had been resected.  The space was then irrigated.  I did not see any further loose material. Three stab wounds were irrigated with saline solution and the anterior and lateral portals were closed with interrupted 4-0 Ethilon.  Sterile bulky dressing was applied followed by a sling.  PLAN:  Office, 1 week.  Percocet for pain.     Claude Manges. Cleophas Dunker, M.D.     PWW/MEDQ  D:  09/17/2012  T:  09/18/2012  Job:  [id number]

## 2012-10-08 ENCOUNTER — Ambulatory Visit: Payer: 59 | Attending: Orthopaedic Surgery

## 2012-10-08 DIAGNOSIS — M25519 Pain in unspecified shoulder: Secondary | ICD-10-CM | POA: Insufficient documentation

## 2012-10-08 DIAGNOSIS — M25619 Stiffness of unspecified shoulder, not elsewhere classified: Secondary | ICD-10-CM | POA: Insufficient documentation

## 2012-10-08 DIAGNOSIS — IMO0001 Reserved for inherently not codable concepts without codable children: Secondary | ICD-10-CM | POA: Insufficient documentation

## 2012-10-28 ENCOUNTER — Encounter: Payer: Self-pay | Admitting: Physician Assistant

## 2013-09-05 ENCOUNTER — Other Ambulatory Visit: Payer: Self-pay | Admitting: Physician Assistant

## 2013-09-05 ENCOUNTER — Encounter (INDEPENDENT_AMBULATORY_CARE_PROVIDER_SITE_OTHER): Payer: Self-pay

## 2013-09-05 ENCOUNTER — Ambulatory Visit
Admission: RE | Admit: 2013-09-05 | Discharge: 2013-09-05 | Disposition: A | Discharge status: Home / Self Care | Payer: 59 | Source: Ambulatory Visit | Attending: Physician Assistant | Admitting: Physician Assistant

## 2013-09-05 DIAGNOSIS — R609 Edema, unspecified: Secondary | ICD-10-CM

## 2013-09-05 DIAGNOSIS — R52 Pain, unspecified: Secondary | ICD-10-CM

## 2014-06-05 ENCOUNTER — Other Ambulatory Visit: Payer: Self-pay | Admitting: Family Medicine

## 2014-06-05 ENCOUNTER — Ambulatory Visit
Admission: RE | Admit: 2014-06-05 | Discharge: 2014-06-05 | Disposition: A | Discharge status: Home / Self Care | Payer: 59 | Source: Ambulatory Visit | Attending: Family Medicine | Admitting: Family Medicine

## 2014-06-05 DIAGNOSIS — M25561 Pain in right knee: Secondary | ICD-10-CM

## 2015-05-17 DIAGNOSIS — H524 Presbyopia: Secondary | ICD-10-CM | POA: Diagnosis not present

## 2015-05-17 DIAGNOSIS — H40003 Preglaucoma, unspecified, bilateral: Secondary | ICD-10-CM | POA: Diagnosis not present

## 2015-06-30 ENCOUNTER — Ambulatory Visit (INDEPENDENT_AMBULATORY_CARE_PROVIDER_SITE_OTHER): Payer: 59 | Admitting: Podiatry

## 2015-06-30 ENCOUNTER — Ambulatory Visit (INDEPENDENT_AMBULATORY_CARE_PROVIDER_SITE_OTHER): Payer: 59

## 2015-06-30 ENCOUNTER — Encounter: Payer: Self-pay | Admitting: Podiatry

## 2015-06-30 DIAGNOSIS — M722 Plantar fascial fibromatosis: Secondary | ICD-10-CM | POA: Diagnosis not present

## 2015-06-30 DIAGNOSIS — L603 Nail dystrophy: Secondary | ICD-10-CM

## 2015-06-30 DIAGNOSIS — B351 Tinea unguium: Secondary | ICD-10-CM | POA: Diagnosis not present

## 2015-06-30 DIAGNOSIS — L608 Other nail disorders: Secondary | ICD-10-CM | POA: Diagnosis not present

## 2015-06-30 MED ORDER — MELOXICAM 15 MG PO TABS: 15.0000 mg | ORAL_TABLET | Freq: Every day | ORAL | Status: DC

## 2015-06-30 MED ORDER — METHYLPREDNISOLONE 4 MG PO TBPK: ORAL_TABLET | ORAL | Status: DC

## 2015-06-30 MED FILL — MELOXICAM 15 MG TABLET: 15 | 30 days supply | Qty: 30 | Fill #0

## 2015-06-30 MED FILL — METHYLPREDNISOLONE 4 MG TAB: 4 | 6 days supply | Qty: 21 | Fill #0

## 2015-06-30 NOTE — Patient Instructions (Signed)
Plantar Fasciitis Plantar fasciitis is a painful foot condition that affects the heel. It occurs when the band of tissue that connects the toes to the heel bone (plantar fascia) becomes irritated. This can happen after exercising too much or doing other repetitive activities (overuse injury). The pain from plantar fasciitis can range from mild irritation to severe pain that makes it difficult for you to walk or move. The pain is usually worse in the morning or after you have been sitting or lying down for a while. CAUSES This condition may be caused by:  Standing for long periods of time.  Wearing shoes that do not fit.  Doing high-impact activities, including running, aerobics, and ballet.  Being overweight.  Having an abnormal way of walking (gait).  Having tight calf muscles.  Having high arches in your feet.  Starting a new athletic activity. SYMPTOMS The main symptom of this condition is heel pain. Other symptoms include:  Pain that gets worse after activity or exercise.  Pain that is worse in the morning or after resting.  Pain that goes away after you walk for a few minutes. DIAGNOSIS This condition may be diagnosed based on your signs and symptoms. Your health care provider will also do a physical exam to check for:  A tender area on the bottom of your foot.  A high arch in your foot.  Pain when you move your foot.  Difficulty moving your foot. You may also need to have imaging studies to confirm the diagnosis. These can include:  X-rays.  Ultrasound.  MRI. TREATMENT  Treatment for plantar fasciitis depends on the severity of the condition. Your treatment may include:  Rest, ice, and over-the-counter pain medicines to manage your pain.  Exercises to stretch your calves and your plantar fascia.  A splint that holds your foot in a stretched, upward position while you sleep (night splint).  Physical therapy to relieve symptoms and prevent problems in the  future.  Cortisone injections to relieve severe pain.  Extracorporeal shock wave therapy (ESWT) to stimulate damaged plantar fascia with electrical impulses. It is often used as a last resort before surgery.  Surgery, if other treatments have not worked after 12 months. HOME CARE INSTRUCTIONS  Take medicines only as directed by your health care provider.  Avoid activities that cause pain.  Roll the bottom of your foot over a bag of ice or a bottle of cold water. Do this for 20 minutes, 3-4 times a day.  Perform simple stretches as directed by your health care provider.  Try wearing athletic shoes with air-sole or gel-sole cushions or soft shoe inserts.  Wear a night splint while sleeping, if directed by your health care provider.  Keep all follow-up appointments with your health care provider. PREVENTION   Do not perform exercises or activities that cause heel pain.  Consider finding low-impact activities if you continue to have problems.  Lose weight if you need to. The best way to prevent plantar fasciitis is to avoid the activities that aggravate your plantar fascia. SEEK MEDICAL CARE IF:  Your symptoms do not go away after treatment with home care measures.  Your pain gets worse.  Your pain affects your ability to move or do your daily activities.   This information is not intended to replace advice given to you by your health care provider. Make sure you discuss any questions you have with your health care provider.   Document Released: 01/10/2001 Document Revised: 01/06/2015 Document Reviewed: 02/25/2014 Elsevier   Interactive Patient Education 2016 [employer]  

## 2015-06-30 NOTE — Progress Notes (Signed)
He presents today with a chief complaint of painful right heel. He states is bothering him for quite some time now but he has just failed to come in for medical treatment. He states that nothing seems to work. He's tried multiple things such as soaking shoe changes anti-inflammatories. He has pain first thing in the morning and then after his been sitting for a while. He is also concerned about the discoloration and thickening of his toenails.  Objective: Vital signs stable alert and oriented 3 pulses are palpable. Neurologic sensorium is intact per Semmes-Weinstein monofilament. Deep tendon reflexes are intact. Muscle strength +5 over 5 dorsiflexion plantar flexors and inverters and everters all into the musculature is intact. Orthopedic evaluation demonstrates all joints distal to the ankle while full range of motion without crepitation. Cutaneous evaluation demonstrates supple well-hydrated cutis thick dystrophic possibly mycotic nails with discoloration. Pain on palpation medially and continue tubercle of the right heel. No pain on medial and lateral compression of the calcaneus. Radiographs the right foot does demonstrate plantar distally oriented calcaneal heel spur with soft tissue increase in density at the plantar fascial calcaneal insertion site. Otherwise no fractures identified. Cutaneous evaluation and history supple well-hydrated cutis no lesions are noted.  Assessment: Plantar fasciitis right foot. Nail dystrophy.  Plan: Simple of the nail and skin were taken today and sent for pathologic evaluation. Injected his right heel today with Kenalog and local anesthetic started him on a Medrol Dosepak to be followed by meloxicam. I also recommended the use of a plantar fascial brace and the night splint. Discussed appropriate shoe gear stretching exercises ice therapy and shoe modification. Follow up with him in 1 month or when his nail samples returned.

## 2015-07-28 ENCOUNTER — Ambulatory Visit (INDEPENDENT_AMBULATORY_CARE_PROVIDER_SITE_OTHER): Payer: 59 | Admitting: Podiatry

## 2015-07-28 ENCOUNTER — Encounter: Payer: Self-pay | Admitting: Podiatry

## 2015-07-28 DIAGNOSIS — M722 Plantar fascial fibromatosis: Secondary | ICD-10-CM

## 2015-07-28 DIAGNOSIS — L603 Nail dystrophy: Secondary | ICD-10-CM

## 2015-07-28 NOTE — Progress Notes (Signed)
He presents today for follow-up of his plantar fasciitis and lab results regarding his discolored toenails. He states that his plantar fasciitis is nearly 100% improvement to his right heel. He states that he overdid it Saturday by working 24 hours on his feet and Sunday he had some soreness. He states that now it is starting to resolve once again. He continues his braces as well as his medications and correct shoe gear.  Objective: Vital signs are stable alert and oriented 3. Pulses are palpable. Pathology report does demonstrate negative fungal infection. However it was concerning for discoloration and pigmentation of the corneum or the outer layer of the nail plate. I have evaluated his nails they are not thick they are discolored and black left and dark brown right. He is of color and has considerable melanocytic streaking or melanocytic onychia. No signs of infection or cancer. His nail plates demonstrated normal hyperpigmentation. He has no pain on palpation medial calcaneal tubercle right heel.  Assessment: Well-healing plantar fasciitis right.  Plan: I recommended that he continue all conservative therapies listed above until he is 100% pain-free and continue for another month. Should he develop any regression he is to notify us immediately.

## 2015-08-05 MED FILL — ESOMEPRAZOLE MAG DR 40 MG C: 40 | 90 days supply | Qty: 90 | Fill #0

## 2015-08-05 MED FILL — ROSUVASTATIN CALCIUM 40 MG: 40 | 90 days supply | Qty: 90 | Fill #0

## 2015-08-05 MED FILL — EZETIMIBE 10 MG TABLET: 10 | 30 days supply | Qty: 30 | Fill #0

## 2015-08-05 MED FILL — LOSARTAN-HCTZ 100-25 MG TAB: 100-25 | 90 days supply | Qty: 90 | Fill #0

## 2015-08-30 MED FILL — MELOXICAM 15 MG TABLET: 15 | 30 days supply | Qty: 30 | Fill #1

## 2015-09-23 MED FILL — EZETIMIBE 10 MG TABLET: 10 | 30 days supply | Qty: 30 | Fill #1

## 2015-10-22 DIAGNOSIS — M109 Gout, unspecified: Secondary | ICD-10-CM | POA: Diagnosis not present

## 2015-10-22 DIAGNOSIS — Z125 Encounter for screening for malignant neoplasm of prostate: Secondary | ICD-10-CM | POA: Diagnosis not present

## 2015-10-22 DIAGNOSIS — I10 Essential (primary) hypertension: Secondary | ICD-10-CM | POA: Diagnosis not present

## 2015-10-22 DIAGNOSIS — E139 Other specified diabetes mellitus without complications: Secondary | ICD-10-CM | POA: Diagnosis not present

## 2015-10-22 DIAGNOSIS — E7800 Pure hypercholesterolemia, unspecified: Secondary | ICD-10-CM | POA: Diagnosis not present

## 2015-10-22 DIAGNOSIS — K219 Gastro-esophageal reflux disease without esophagitis: Secondary | ICD-10-CM | POA: Diagnosis not present

## 2015-10-22 DIAGNOSIS — Z0000 Encounter for general adult medical examination without abnormal findings: Secondary | ICD-10-CM | POA: Diagnosis not present

## 2015-11-01 MED FILL — MELOXICAM 15 MG TABLET: 15 | 30 days supply | Qty: 30 | Fill #2

## 2015-11-10 MED FILL — EZETIMIBE 10 MG TABLET: 10 | 30 days supply | Qty: 30 | Fill #2

## 2015-11-15 DIAGNOSIS — H40013 Open angle with borderline findings, low risk, bilateral: Secondary | ICD-10-CM | POA: Diagnosis not present

## 2015-12-23 DIAGNOSIS — M25519 Pain in unspecified shoulder: Secondary | ICD-10-CM | POA: Diagnosis not present

## 2015-12-23 MED FILL — CYCLOBENZAPRINE 10 MG TAB: 10 | 10 days supply | Qty: 30 | Fill #0

## 2015-12-27 ENCOUNTER — Ambulatory Visit (INDEPENDENT_AMBULATORY_CARE_PROVIDER_SITE_OTHER): Payer: 59 | Admitting: Podiatry

## 2015-12-27 DIAGNOSIS — L600 Ingrowing nail: Secondary | ICD-10-CM | POA: Diagnosis not present

## 2015-12-27 MED ORDER — NEOMYCIN-POLYMYXIN-HC 3.5-10000-1 OT SOLN: OTIC | 0 refills | Status: DC

## 2015-12-27 MED FILL — NEO/POLYMYXIN/HC EAR SOLN: 3.5-10000-1 | 10 days supply | Qty: 10 | Fill #0

## 2015-12-27 NOTE — Patient Instructions (Signed)

## 2015-12-27 NOTE — Progress Notes (Signed)
He presents today with a chief complaint of a painful ingrown toenail to the tibial border second digit of the right foot. He states extend this way for the past few weeks since the getting worse he has had a history with ingrown toenails before but not to this toe.  Objective: Vital signs are stable he is alert and oriented 3 very pleasant gentleman. He works in [employer] [facility] [employer]. Pulses are strongly palpable. Sharp intubated nail margin with erythema and edema and pain on palpation medial margin of the second digit right foot.  Assessment: Abscess ingrown toenail second digit right foot.  Plan: Performed a chemical matrixectomy today to the tibial border of the second digit of the right foot after local anesthesia was administered. He tolerated the procedure well. He was provided with both oral and written home-going instructions as well as a prescription for Cortisporin Otic reapplied twice daily after soaking. I will follow-up with him in 1 week.

## 2016-01-05 ENCOUNTER — Ambulatory Visit: Payer: 59 | Admitting: Podiatry

## 2016-01-12 ENCOUNTER — Ambulatory Visit: Payer: 59 | Admitting: Podiatry

## 2016-01-23 ENCOUNTER — Emergency Department (HOSPITAL_COMMUNITY)
Admission: EM | Admit: 2016-01-23 | Discharge: 2016-01-24 | Disposition: A | Discharge status: Home / Self Care | Payer: 59 | Attending: Emergency Medicine | Admitting: Emergency Medicine

## 2016-01-23 ENCOUNTER — Encounter (HOSPITAL_COMMUNITY): Payer: Self-pay | Admitting: *Deleted

## 2016-01-23 DIAGNOSIS — Z955 Presence of coronary angioplasty implant and graft: Secondary | ICD-10-CM | POA: Diagnosis not present

## 2016-01-23 DIAGNOSIS — I10 Essential (primary) hypertension: Secondary | ICD-10-CM | POA: Diagnosis not present

## 2016-01-23 DIAGNOSIS — Y939 Activity, unspecified: Secondary | ICD-10-CM | POA: Insufficient documentation

## 2016-01-23 DIAGNOSIS — Z79899 Other long term (current) drug therapy: Secondary | ICD-10-CM | POA: Insufficient documentation

## 2016-01-23 DIAGNOSIS — M25532 Pain in left wrist: Secondary | ICD-10-CM | POA: Insufficient documentation

## 2016-01-23 DIAGNOSIS — Y929 Unspecified place or not applicable: Secondary | ICD-10-CM | POA: Diagnosis not present

## 2016-01-23 DIAGNOSIS — D649 Anemia, unspecified: Secondary | ICD-10-CM

## 2016-01-23 DIAGNOSIS — Z87891 Personal history of nicotine dependence: Secondary | ICD-10-CM | POA: Insufficient documentation

## 2016-01-23 DIAGNOSIS — X58XXXA Exposure to other specified factors, initial encounter: Secondary | ICD-10-CM | POA: Diagnosis not present

## 2016-01-23 DIAGNOSIS — Y999 Unspecified external cause status: Secondary | ICD-10-CM | POA: Diagnosis not present

## 2016-01-23 MED ORDER — OXYCODONE-ACETAMINOPHEN 5-325 MG PO TABS
1.0000 | ORAL_TABLET | Freq: Once | ORAL | Status: AC
  Administered 2016-01-23: 1 via ORAL
  Filled 2016-01-23: qty 1

## 2016-01-23 NOTE — ED Provider Notes (Signed)
[facility]EMER[facility]Y DEPT Provider Note   CSN: [id number] Arrival date & time: 01/23/16  2304   By signing my name below, I, Kerry Nelson, attest that this documentation has been prepared under the direction and in the presence of Dione Boozeavid Sangeeta Youse, MD  Electronically Signed: Clovis PuAvnee Nelson, ED Scribe. 01/23/16. 11:48 PM.   History   Chief Complaint Chief Complaint  Patient presents with  . hand swelling     The history is provided by the patient. No language interpreter was used.   HPI Comments:  Kerry Nelson is a 50 y.o. male who presents to the Emergency Department complaining of worsening, "5/10" moderate left wrist pain onset yesterday night. Pt notes pain is exacerbated to the touch and with movement. He has taken tylenol and diclofenac with mild relief. Associated symptoms include swelling and coldness to his fingers as well as swelling in his legs. He denies any chills, fevers, or diaphoresis. He also denies any similar symptoms in the past.   Past Medical History:  Diagnosis Date  . GERD (gastroesophageal reflux disease)   . Hyperlipidemia   . Hypertension   . Sleep apnea 2010   does not use Cpap    Patient Active Problem List   Diagnosis Date Noted  . ELBOW PAIN, LEFT 08/05/2009  . SHOULDER IMPINGEMENT SYNDROME 08/05/2009  . LATERAL EPICONDYLITIS 08/05/2009  . ANKLE PAIN, LEFT 02/10/2009  . OTHER ACQUIRED DEFORMITY OF ANKLE AND FOOT OTHER 02/10/2009  . OSTEOARTHRITIS, KNEE, RIGHT 03/10/2008  . KNEE PAIN 03/10/2008  . UNEQUAL LEG LENGTH 03/10/2008    Past Surgical History:  Procedure Laterality Date  . CARDIAC CATHETERIZATION  2010   Dr Allyson SabalBerry   . CORRECTION HAMMER TOE Left   . FOOT SURGERY     bone spur removal  . HERNIA REPAIR    . LIPOMA EXCISION     x2  . SHOULDER ARTHROSCOPY WITH DISTAL CLAVICLE RESECTION Left 09/17/2012   Procedure: SHOULDER ARTHROSCOPY WITH DISTAL CLAVICLE RESECTION;  Surgeon: Valeria BatmanPeter W Whitfield, MD;  [facility];  Service: Orthopedics;   Laterality: Left;  Left shoulder arthroscopy, subacromial decompression, distal clavicle resection.  Marland Kitchen. UMBILICAL HERNIA REPAIR         Home Medications    Prior to Admission medications   Medication Sig Start Date End Date Taking? Authorizing Provider  esomeprazole (NEXIUM) 40 MG capsule Take 40 mg by mouth daily before breakfast.    Historical Provider, MD  ezetimibe (ZETIA) 10 MG tablet Take 10 mg by mouth daily.    Historical Provider, MD  ibuprofen (ADVIL,MOTRIN) 200 MG tablet Take 400-600 mg by mouth every 6 (six) hours as needed for pain.    Historical Provider, MD  losartan-hydrochlorothiazide (HYZAAR) 100-25 MG per tablet Take 1 tablet by mouth daily.    Historical Provider, MD  meloxicam (MOBIC) 15 MG tablet Take 1 tablet (15 mg total) by mouth daily. 06/30/15   Max T Hyatt, DPM  methylPREDNISolone (MEDROL) 4 MG TBPK tablet Tapering 6 day dose pack 06/30/15   Max T Hyatt, DPM  neomycin-polymyxin-hydrocortisone (CORTISPORIN) otic solution APPLY DROPS 1-2 DROPS TWICE A DAY 12/27/15   Max T Hyatt, DPM  oxyCODONE-acetaminophen (PERCOCET/ROXICET) 5-325 MG per tablet Take 1-2 tablets by mouth every 4 (four) hours as needed. 09/17/12   Jetty PeeksBrian D Petrarca, PA-C  rosuvastatin (CRESTOR) 40 MG tablet Take 40 mg by mouth daily.    Historical Provider, MD    Family History No family history on file.  Social History Social History  Substance Use  Topics  . Smoking status: Former Smoker    Packs/day: 0.50    Years: 25.00    Types: Cigarettes    Quit date: 09/21/2013  . Smokeless tobacco: Never Used  . Alcohol use 3.6 oz/week    6 Cans of beer per week     Allergies   Review of patient's allergies indicates no known allergies.   Review of Systems Review of Systems  Constitutional: Negative for chills, diaphoresis and fever.  Musculoskeletal: Positive for arthralgias and joint swelling.  All other systems reviewed and are negative.    Physical Exam Updated Vital Signs BP (!)  178/103 (BP Location: Right Arm)   Pulse 96   Temp 98.4 F (36.9 C) (Oral)   Resp 18   Ht 6' (1.829 m)   Wt (!) 325 lb (147.4 kg)   SpO2 100%   BMI 44.08 kg/m   Physical Exam  Constitutional: He is oriented to person, place, and time. He appears well-developed and well-nourished.  HENT:  Head: Normocephalic and atraumatic.  Eyes: EOM are normal. Pupils are equal, round, and reactive to light.  Neck: Normal range of motion. Neck supple. No JVD present.  Cardiovascular: Normal rate, regular rhythm and normal heart sounds.   No murmur heard. Pulmonary/Chest: Effort normal and breath sounds normal. He has no wheezes. He has no rales. He exhibits no tenderness.  Abdominal: Soft. Bowel sounds are normal. He exhibits no distension and no mass. There is no tenderness.  Musculoskeletal: Normal range of motion. He exhibits no edema.  Mild swelling L wrist. L wrist TTP diffusely. Pain with any passive ROM. Capillary refill 2 seconds. Hand temperature symmetric. 1+ pretibial edema.   Lymphadenopathy:    He has no cervical adenopathy.  Neurological: He is alert and oriented to person, place, and time. No cranial nerve deficit. He exhibits normal muscle tone. Coordination normal.  Skin: Skin is warm and dry. No rash noted.  Psychiatric: He has a normal mood and affect. His behavior is normal. Judgment and thought content normal.  Nursing note and vitals reviewed.    ED Treatments / Results  DIAGNOSTIC STUDIES:  Oxygen Saturation is 100% on RA, normal by my interpretation.    COORDINATION OF CARE:  11:41 PM Discussed treatment plan with pt at bedside and pt agreed to plan.  Labs (all labs ordered are listed, but only abnormal results are displayed) Labs Reviewed  COMPREHENSIVE METABOLIC PANEL - Abnormal; Notable for the following:       Result Value   Chloride 98 (*)    Glucose, Bld 148 (*)    All other components within normal limits  CBC WITH DIFFERENTIAL/PLATELET - Abnormal;  Notable for the following:    Hemoglobin 12.3 (*)    HCT 37.9 (*)    All other components within normal limits  URIC ACID    Radiology Dg Wrist Complete Left  Result Date: 01/24/2016 CLINICAL DATA:  50 year old male with left wrist pain. No known injury. EXAM: LEFT WRIST - COMPLETE 3+ VIEW COMPARISON:  None. FINDINGS: There is no evidence of fracture or dislocation. There is no evidence of arthropathy or other focal bone abnormality. Soft tissues are unremarkable. IMPRESSION: Negative. Electronically Signed   By: Elgie Collard M.D.   On: 01/24/2016 01:00    Procedures Procedures (including critical care time)  Medications Ordered in ED Medications  predniSONE (DELTASONE) tablet 60 mg (not administered)  oxyCODONE-acetaminophen (PERCOCET/ROXICET) 5-325 MG per tablet 1 tablet (1 tablet Oral Given 01/23/16 2353)  Initial Impression / Assessment and Plan / ED Course  I have reviewed the triage vital signs and the nursing notes.  Pertinent labs & imaging results that were available during my care of the patient were reviewed by me and considered in my medical decision making (see chart for details).  Clinical Course   Left wrist pain and swelling strongly suggestive of gout. He has no prior history of gout. Review of old records shows no relevant past visits. I cannot find a uric acid level in his laboratory work on record. He is placed in a wrist splint for comfort and is being sent for x-ray of. Screening labs are obtained including uric acid level. He is given oxycodone-acetaminophen for pain.  Wrist x-rays unremarkable. Laboratory workup shows blood glucose in the prediabetic range. Patient is aware of this and it is being followed by his PCP. Also, mild anemia is present. Uric acid is normal although in the higher end of normal. I still believe that his joint pain is from gout. There was fairly good pain relief with oxycodone-acetaminophen. He is given initial dose of prednisone.  He is discharged with instructions to wear the wrist splint as needed and prescriptions are given for prednisone and oxycodone-acetaminophen. Referred back to his PCP for follow-up. Follow-up with hand surgery if symptoms are not improving.  Final Clinical Impressions(s) / ED Diagnoses   Final diagnoses:  Pain in joint of left wrist  Normochromic normocytic anemia    New Prescriptions New Prescriptions   OXYCODONE-ACETAMINOPHEN (PERCOCET) 5-325 MG TABLET    Take 1 tablet by mouth every 4 (four) hours as needed for moderate pain.   PREDNISONE (DELTASONE) 50 MG TABLET    Take 1 tablet (50 mg total) by mouth daily.  .I personally performed the services described in this documentation, which was scribed in my presence. The recorded information has been reviewed and is accurate.       Dione Booze, MD 01/24/16 [phone]

## 2016-01-23 NOTE — ED Triage Notes (Signed)
Patient presents with c/o left hand pain and swelling - denies injury.  Fingers cool to touch with slow cap refill

## 2016-01-24 ENCOUNTER — Emergency Department (HOSPITAL_COMMUNITY): Payer: 59

## 2016-01-24 DIAGNOSIS — I10 Essential (primary) hypertension: Secondary | ICD-10-CM | POA: Diagnosis not present

## 2016-01-24 DIAGNOSIS — Z87891 Personal history of nicotine dependence: Secondary | ICD-10-CM | POA: Diagnosis not present

## 2016-01-24 DIAGNOSIS — Z79899 Other long term (current) drug therapy: Secondary | ICD-10-CM | POA: Diagnosis not present

## 2016-01-24 DIAGNOSIS — M25532 Pain in left wrist: Secondary | ICD-10-CM | POA: Diagnosis not present

## 2016-01-24 DIAGNOSIS — Z955 Presence of coronary angioplasty implant and graft: Secondary | ICD-10-CM | POA: Diagnosis not present

## 2016-01-24 LAB — CBC WITH DIFFERENTIAL/PLATELET
Basophils Absolute: 0 10*3/uL (ref 0.0–0.1)
Basophils Relative: 0 %
EOS ABS: 0.1 10*3/uL (ref 0.0–0.7)
Eosinophils Relative: 1 %
HEMATOCRIT: 37.9 % — AB (ref 39.0–52.0)
HEMOGLOBIN: 12.3 g/dL — AB (ref 13.0–17.0)
LYMPHS ABS: 1.9 10*3/uL (ref 0.7–4.0)
LYMPHS PCT: 19 %
MCH: 28.5 pg (ref 26.0–34.0)
MCHC: 32.5 g/dL (ref 30.0–36.0)
MCV: 87.7 fL (ref 78.0–100.0)
MONOS PCT: 7 %
Monocytes Absolute: 0.7 10*3/uL (ref 0.1–1.0)
NEUTROS PCT: 73 %
Neutro Abs: 7.5 10*3/uL (ref 1.7–7.7)
Platelets: 210 10*3/uL (ref 150–400)
RBC: 4.32 MIL/uL (ref 4.22–5.81)
RDW: 13.1 % (ref 11.5–15.5)
WBC: 10.2 10*3/uL (ref 4.0–10.5)

## 2016-01-24 LAB — COMPREHENSIVE METABOLIC PANEL
ALT: 33 U/L (ref 17–63)
AST: 35 U/L (ref 15–41)
Albumin: 3.9 g/dL (ref 3.5–5.0)
Alkaline Phosphatase: 52 U/L (ref 38–126)
Anion gap: 10 (ref 5–15)
BUN: 9 mg/dL (ref 6–20)
CHLORIDE: 98 mmol/L — AB (ref 101–111)
CO2: 27 mmol/L (ref 22–32)
CREATININE: 0.95 mg/dL (ref 0.61–1.24)
Calcium: 9 mg/dL (ref 8.9–10.3)
GFR calc non Af Amer: >60 mL/min (ref >60)
Glucose, Bld: 148 mg/dL — ABNORMAL HIGH (ref 65–99)
POTASSIUM: 4 mmol/L (ref 3.5–5.1)
SODIUM: 135 mmol/L (ref 135–145)
Total Bilirubin: 0.6 mg/dL (ref 0.3–1.2)
Total Protein: 7 g/dL (ref 6.5–8.1)

## 2016-01-24 LAB — URIC ACID: URIC ACID, SERUM: 6.6 mg/dL (ref 4.4–7.6)

## 2016-01-24 MED ORDER — OXYCODONE-ACETAMINOPHEN 5-325 MG PO TABS: 1.0000 | ORAL_TABLET | ORAL | 0 refills | Status: DC | PRN

## 2016-01-24 MED ORDER — PREDNISONE 20 MG PO TABS
60.0000 mg | ORAL_TABLET | Freq: Once | ORAL | Status: AC
  Administered 2016-01-24: 60 mg via ORAL
  Filled 2016-01-24: qty 3

## 2016-01-24 MED ORDER — PREDNISONE 50 MG PO TABS: 50.0000 mg | ORAL_TABLET | Freq: Every day | ORAL | 0 refills | Status: DC

## 2016-01-24 MED FILL — OXYCODONE/APAP 5-325: 5-325 | 3 days supply | Qty: 15 | Fill #0

## 2016-01-24 MED FILL — predniSONE 50 MG TABS: 50 | 5 days supply | Qty: 5 | Fill #0

## 2016-01-27 MED FILL — ROSUVASTATIN CALCIUM 40 MG: 40 | 90 days supply | Qty: 90 | Fill #1

## 2016-01-27 MED FILL — LOSARTAN-HCTZ 100-25 MG TAB: 100-25 | 90 days supply | Qty: 90 | Fill #1

## 2016-01-27 MED FILL — EZETIMIBE 10 MG TABLET: 10 | 30 days supply | Qty: 30 | Fill #3

## 2016-01-27 MED FILL — ESOMEPRAZOLE MAG DR 40 MG C: 40 | 90 days supply | Qty: 90 | Fill #1

## 2016-01-28 DIAGNOSIS — M25539 Pain in unspecified wrist: Secondary | ICD-10-CM | POA: Diagnosis not present

## 2016-01-28 DIAGNOSIS — Z23 Encounter for immunization: Secondary | ICD-10-CM | POA: Diagnosis not present

## 2016-02-24 ENCOUNTER — Emergency Department (HOSPITAL_COMMUNITY): Payer: 59

## 2016-02-24 ENCOUNTER — Encounter (HOSPITAL_COMMUNITY): Payer: Self-pay | Admitting: Emergency Medicine

## 2016-02-24 ENCOUNTER — Emergency Department (HOSPITAL_COMMUNITY)
Admission: EM | Admit: 2016-02-24 | Discharge: 2016-02-24 | Disposition: A | Discharge status: Home / Self Care | Payer: 59 | Attending: Emergency Medicine | Admitting: Emergency Medicine

## 2016-02-24 DIAGNOSIS — R0781 Pleurodynia: Secondary | ICD-10-CM | POA: Insufficient documentation

## 2016-02-24 DIAGNOSIS — Z87891 Personal history of nicotine dependence: Secondary | ICD-10-CM | POA: Insufficient documentation

## 2016-02-24 DIAGNOSIS — R071 Chest pain on breathing: Secondary | ICD-10-CM | POA: Diagnosis present

## 2016-02-24 DIAGNOSIS — R600 Localized edema: Secondary | ICD-10-CM | POA: Diagnosis not present

## 2016-02-24 DIAGNOSIS — R079 Chest pain, unspecified: Secondary | ICD-10-CM | POA: Diagnosis not present

## 2016-02-24 DIAGNOSIS — I10 Essential (primary) hypertension: Secondary | ICD-10-CM | POA: Insufficient documentation

## 2016-02-24 LAB — COMPREHENSIVE METABOLIC PANEL
ALT: 36 U/L (ref 17–63)
AST: 32 U/L (ref 15–41)
Albumin: 4.2 g/dL (ref 3.5–5.0)
Alkaline Phosphatase: 54 U/L (ref 38–126)
Anion gap: 9 (ref 5–15)
BUN: 8 mg/dL (ref 6–20)
CHLORIDE: 102 mmol/L (ref 101–111)
CO2: 29 mmol/L (ref 22–32)
Calcium: 9.5 mg/dL (ref 8.9–10.3)
Creatinine, Ser: 0.95 mg/dL (ref 0.61–1.24)
GFR calc Af Amer: >60 mL/min (ref >60)
Glucose, Bld: 100 mg/dL — ABNORMAL HIGH (ref 65–99)
POTASSIUM: 3.4 mmol/L — AB (ref 3.5–5.1)
Sodium: 140 mmol/L (ref 135–145)
Total Bilirubin: 0.7 mg/dL (ref 0.3–1.2)
Total Protein: 7.3 g/dL (ref 6.5–8.1)

## 2016-02-24 LAB — I-STAT TROPONIN, ED
TROPONIN I, POC: 0 ng/mL (ref 0.00–0.08)
Troponin i, poc: 0 ng/mL (ref 0.00–0.08)

## 2016-02-24 LAB — CBC
HEMATOCRIT: 40.3 % (ref 39.0–52.0)
Hemoglobin: 13.3 g/dL (ref 13.0–17.0)
MCH: 28.4 pg (ref 26.0–34.0)
MCHC: 33 g/dL (ref 30.0–36.0)
MCV: 86.1 fL (ref 78.0–100.0)
PLATELETS: 248 10*3/uL (ref 150–400)
RBC: 4.68 MIL/uL (ref 4.22–5.81)
RDW: 13.3 % (ref 11.5–15.5)
WBC: 9 10*3/uL (ref 4.0–10.5)

## 2016-02-24 LAB — D-DIMER, QUANTITATIVE (NOT AT ARMC): D DIMER QUANT: 0.57 ug{FEU}/mL — AB (ref 0.00–0.50)

## 2016-02-24 LAB — BRAIN NATRIURETIC PEPTIDE: B NATRIURETIC PEPTIDE 5: 6.9 pg/mL (ref 0.0–100.0)

## 2016-02-24 MED ORDER — ASPIRIN 81 MG PO CHEW
324.0000 mg | CHEWABLE_TABLET | Freq: Once | ORAL | Status: AC
Start: 2016-02-24 — End: 2016-02-24
  Administered 2016-02-24: 324 mg via ORAL
  Filled 2016-02-24: qty 4

## 2016-02-24 MED ORDER — IOPAMIDOL (ISOVUE-370) INJECTION 76%
INTRAVENOUS | Status: AC
  Administered 2016-02-24: 80 mL
  Filled 2016-02-24: qty 100

## 2016-02-24 NOTE — ED Notes (Signed)
Patient went to CT

## 2016-02-24 NOTE — ED Provider Notes (Signed)
CT negative for PE or other acute process. Pain is much better. Started on baby aspirin, NSAIDs as needed. Follow-up with PCP and/or cardiology. Discussed return precautions. Second troponin negative.   Pricilla LovelessScott Dameka Younker, MD 02/24/16 [phone]

## 2016-02-24 NOTE — ED Provider Notes (Signed)
[facility] Provider Note   CSN: [id number] Arrival date & time: 02/24/16  1417     History   Chief Complaint Chief Complaint  Patient presents with  . Chest Pain    HPI Kerry Nelson is a 50 y.o. male.  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 Describes it as sharp, in a focal area of the left chest, however with radiation to the neck. It's a sharp pain that comes and goes lasting 20-30 seconds spontaneously. It is not exertional, not worsened by arm movements, not worsened by palpation. He reports it is worse with deep breaths. Denies any recent trauma or recent increase in activity. Denies cough, shortness of breath, fevers, nausea, diaphoresis.  Reports he has prediabetes, hypertension, hyperlipidemia. Has quit smoking. His mother had heart disease starting at age less than 3455. He had a cardiac catheterization done 10 years ago with Dr. Allyson SabalBerry which showed no evidence of coronary artery disease.  Past Medical History:  Diagnosis Date  . GERD (gastroesophageal reflux disease)   . Hyperlipidemia   . Hypertension   . Sleep apnea 2010   does not use Cpap    Patient Active Problem List   Diagnosis Date Noted  . ELBOW PAIN, LEFT 08/05/2009  . SHOULDER IMPINGEMENT SYNDROME 08/05/2009  . LATERAL EPICONDYLITIS 08/05/2009  . ANKLE PAIN, LEFT 02/10/2009  . OTHER ACQUIRED DEFORMITY OF ANKLE AND FOOT OTHER 02/10/2009  . OSTEOARTHRITIS, KNEE, RIGHT 03/10/2008  . KNEE PAIN 03/10/2008  . UNEQUAL LEG LENGTH 03/10/2008    Past Surgical History:  Procedure Laterality Date  . CARDIAC CATHETERIZATION  2010   Dr Allyson SabalBerry   . CORRECTION HAMMER TOE Left   . FOOT SURGERY     bone spur removal  . HERNIA REPAIR    . LIPOMA EXCISION     x2  . SHOULDER ARTHROSCOPY WITH DISTAL CLAVICLE RESECTION Left 09/17/2012   Procedure: SHOULDER ARTHROSCOPY WITH DISTAL CLAVICLE RESECTION;  Surgeon: Valeria BatmanPeter W Whitfield, MD;  [facility];   Service: Orthopedics;  Laterality: Left;  Left shoulder arthroscopy, subacromial decompression, distal clavicle resection.  Marland Kitchen. UMBILICAL HERNIA REPAIR         Home Medications    Prior to Admission medications   Medication Sig Start Date End Date Taking? Authorizing Provider  esomeprazole (NEXIUM) 40 MG capsule Take 40 mg by mouth daily before breakfast.    Historical Provider, MD  ezetimibe (ZETIA) 10 MG tablet Take 10 mg by mouth daily.    Historical Provider, MD  losartan-hydrochlorothiazide (HYZAAR) 100-25 MG per tablet Take 1 tablet by mouth daily.    Historical Provider, MD  oxyCODONE-acetaminophen (PERCOCET) 5-325 MG tablet Take 1 tablet by mouth every 4 (four) hours as needed for moderate pain. 01/24/16   Dione Boozeavid Glick, MD  predniSONE (DELTASONE) 50 MG tablet Take 1 tablet (50 mg total) by mouth daily. 01/24/16   Dione Boozeavid Glick, MD  rosuvastatin (CRESTOR) 40 MG tablet Take 40 mg by mouth daily.    Historical Provider, MD    Family History History reviewed. No pertinent family history.  Social History Social History  Substance Use Topics  . Smoking status: Former Smoker    Packs/day: 0.50    Years: 25.00    Types: Cigarettes    Quit date: 09/21/2013  . Smokeless tobacco: Never Used  . Alcohol use 3.6 oz/week    6 Cans of beer per week     Allergies   Review  of patient's allergies indicates no known allergies.   Review of Systems Review of Systems  Constitutional: Negative for fatigue and fever.  HENT: Negative for sore throat.   Eyes: Negative for visual disturbance.  Respiratory: Negative for cough and shortness of breath.   Cardiovascular: Positive for chest pain. Negative for leg swelling.  Gastrointestinal: Negative for abdominal pain, nausea and vomiting.  Genitourinary: Negative for difficulty urinating.  Musculoskeletal: Negative for back pain and neck stiffness.  Skin: Negative for rash.  Neurological: Negative for syncope, light-headedness and headaches.      Physical Exam Updated Vital Signs BP 130/70   Pulse 91   Temp 98.2 F (36.8 C) (Oral)   Resp 23   Ht 6' (1.829 m)   Wt (!) 323 lb (146.5 kg)   SpO2 98%   BMI 43.81 kg/m   Physical Exam  Constitutional: He is oriented to person, place, and time. He appears well-developed and well-nourished. No distress.  HENT:  Head: Normocephalic and atraumatic.  Eyes: Conjunctivae and EOM are normal.  Neck: Normal range of motion.  Cardiovascular: Normal rate, regular rhythm, normal heart sounds and intact distal pulses.  Exam reveals no gallop and no friction rub.   No murmur heard. Pulmonary/Chest: Effort normal and breath sounds normal. No respiratory distress. He has no wheezes. He has no rales. He exhibits no tenderness.  Abdominal: Soft. He exhibits no distension. There is no tenderness. There is no guarding.  Musculoskeletal: He exhibits edema (trace bilateral sock lines).  Neurological: He is alert and oriented to person, place, and time.  Skin: Skin is warm and dry. He is not diaphoretic.  Nursing note and vitals reviewed.    ED Treatments / Results  Labs (all labs ordered are listed, but only abnormal results are displayed) Labs Reviewed  D-DIMER, QUANTITATIVE (NOT AT [facility]) - Abnormal; Notable for the following:       Result Value   D-Dimer, Quant 0.57 (*)    All other components within normal limits  COMPREHENSIVE METABOLIC PANEL - Abnormal; Notable for the following:    Potassium 3.4 (*)    Glucose, Bld 100 (*)    All other components within normal limits  CBC  BRAIN NATRIURETIC PEPTIDE  I-STAT TROPOININ, ED    EKG  EKG Interpretation  Date/Time:  Thursday February 24 2016 14:20:11 EDT Ventricular Rate:  104 PR Interval:  176 QRS Duration: 86 QT Interval:  348 QTC Calculation: 457 R Axis:   59 Text Interpretation:  Sinus tachycardia Nonspecific T wave abnormality Abnormal ECG No significant change since last tracing , rate has increased Confirmed by  [facility] MD, Randale Carvalho ([id number]) on 02/24/2016 2:34:37 PM       Radiology Dg Chest 2 View  Result Date: 02/24/2016 CLINICAL DATA:  Chest pain radiating into the neck. EXAM: CHEST  2 VIEW COMPARISON:  09/10/2012 FINDINGS: Mild thoracic spondylosis. The lungs appear clear. Cardiac and mediastinal contours normal. No pleural effusion identified. IMPRESSION: 1.  No active cardiopulmonary disease is radiographically apparent. 2. Mild thoracic spondylosis. Electronically Signed   By: Gaylyn Rong M.D.   On: 02/24/2016 15:06    Procedures Procedures (including critical care time)  Medications Ordered in ED Medications  iopamidol (ISOVUE-370) 76 % injection (not administered)  aspirin chewable tablet 324 mg (324 mg Oral Given 02/24/16 1629)  Cath 2007 Dr. Allyson Sabal:  SELECTIVE CORONARY ANGIOGRAPHY:  1. Left main: There was no true left main.  There was a double-barrel      left main with  separate ostium of the LAD and circumflex.  2. LAD: Widely patent.  3. Circumflex: Widely patent.  4. Right coronary:  was dominant and widely patent.  Initial Impression / Assessment and Plan / ED Course  I have reviewed the triage vital signs and the nursing notes.  Pertinent labs & imaging results that were available during my care of the patient were reviewed by me and considered in my medical decision making (see chart for details).  Clinical Course   50 year old male with a history of hypertension, hyperlipidemia, prediabetes, prior cardiac cath 10 years ago which showed no coronary artery disease, who presents with concern for episodes of sharp chest pain beginning this AM with radiation to the neck.  He has equal pulses bilaterally in upper and lower extremities, no sign of dissection on x-ray, and have low suspicion for this based on history. Chest x-ray shows no sign of pneumothorax, no pneumonia. Patient is low risk Wells, and given pleuritic chest pain, initial tachycardia, and d-dimer was ordered  which was .57. CTA PE study ordered and pending. Given his risk factors, and presence of left-sided chest pain radiating to the neck, his heart score is a 4.  Discussed possible admission given risk factors and risk of heart disease.  Patient understands risk of heart disease and prefers close follow up with Dr. Allyson Sabal if he has 2 negative troponins. Chest pain is atypical, nonexertional and feel close follow up per his preference is appropriate.  CTA PE study and delta troponin pending at time of transfer of care.   Final Clinical Impressions(s) / ED Diagnoses   Final diagnoses:  Chest pain, unspecified type    New Prescriptions New Prescriptions   No medications on file     Alvira Monday, MD 02/24/16 1655

## 2016-02-24 NOTE — ED Triage Notes (Signed)
Left side CP intermittently since 9 am today. Worsens with deep breath. No identified activity that brings it on or makes it go away. Radiates to left neck. Denies SOB, N/V, diaphoresis. Currently pain free, last occurrence about 5 minutes ago. Rates 2-3/10.

## 2016-02-24 NOTE — ED Notes (Signed)
Placed patient back on the monitor after xray 

## 2016-02-29 ENCOUNTER — Telehealth: Payer: Self-pay | Admitting: Cardiology

## 2016-02-29 NOTE — Telephone Encounter (Signed)
Received records from [facility] for appointment on 03/06/16 with Dr Jens Somrenshaw.  [facility] for Dr Ludwig Clarksrenshaw's schedule on 03/06/16. lp

## 2016-02-29 NOTE — Progress Notes (Signed)
Referring: Kerry Nelson, Kerry Nelson, PAC  HPI: 50 yo male for evaluation of chest pain. Cath 2007 with EF 60 and no CAD. CTA 10/17 showed no pulmonary embolus. Seen in ER 10/17 with CP and troponin, BNP normal, Hgb 13.3, nl LFTs. Patient typically only has dyspnea with vigorous activities. There is no orthopnea or PND. Occasional minimal pedal edema. Patient complained of chest pain on the day he was evaluated in the emergency room. It was in the left breast area and described as a sharp pain lasting 5-10 seconds. It increased with inspiration. No radiation or associated symptoms. He does not have exertional chest pain. Because of the above cardiology was asked to evaluate.   Current Outpatient Prescriptions  Medication Sig Dispense Refill  . esomeprazole (NEXIUM) 40 MG capsule Take 40 mg by mouth daily before breakfast.    . ezetimibe (ZETIA) 10 MG tablet Take 10 mg by mouth daily.    Marland Kitchen. losartan-hydrochlorothiazide (HYZAAR) 100-25 MG per tablet Take 1 tablet by mouth daily.    . rosuvastatin (CRESTOR) 40 MG tablet Take 40 mg by mouth daily.     No current facility-administered medications for this visit.     No Known Allergies   Past Medical History:  Diagnosis Date  . Chest pain   . GERD (gastroesophageal reflux disease)   . Hyperlipidemia   . Hypertension   . Prediabetes   . Sleep apnea 2010   does not use Cpap    Past Surgical History:  Procedure Laterality Date  . CARDIAC CATHETERIZATION  2010   Dr Kerry Nelson   . CORRECTION HAMMER TOE Left   . FOOT SURGERY     bone spur removal  . HERNIA REPAIR    . LIPOMA EXCISION     x2  . SHOULDER ARTHROSCOPY WITH DISTAL CLAVICLE RESECTION Left 09/17/2012   Procedure: SHOULDER ARTHROSCOPY WITH DISTAL CLAVICLE RESECTION;  Surgeon: Valeria BatmanPeter W Whitfield, MD;  [facility];  Service: Orthopedics;  Laterality: Left;  Left shoulder arthroscopy, subacromial decompression, distal clavicle resection.  Marland Kitchen. UMBILICAL HERNIA REPAIR      Social History    Social History  . Marital status: Married    Spouse name: N/A  . Number of children: 2  . Years of education: N/A   Occupational History  .      Management   Social History Main Topics  . Smoking status: Former Smoker    Packs/day: 0.50    Years: 25.00    Types: Cigarettes    Quit date: 09/21/2013  . Smokeless tobacco: Never Used  . Alcohol use 3.6 oz/week    6 Cans of beer per week  . Drug use: No  . Sexual activity: Not on file   Other Topics Concern  . Not on file   Social History Narrative  . No narrative on file    Family History  Problem Relation Age of Onset  . Asthma Mother   . Depression Mother   . Hypertension Brother   . Seizures Brother     ROS: no fevers or chills, productive cough, hemoptysis, dysphasia, odynophagia, melena, hematochezia, dysuria, hematuria, rash, seizure activity, orthopnea, PND, pedal edema, claudication. Remaining systems are negative.  Physical Exam:   Blood pressure (!) 152/82, pulse 98, height 6' (1.829 m), weight (!) 319 lb (144.7 kg).  General:  Well developed/obese in NAD Skin warm/dry Patient not depressed No peripheral clubbing Back-normal HEENT-normal/normal eyelids Neck supple/normal carotid upstroke bilaterally; no bruits; no JVD; no thyromegaly chest - CTA/ normal  expansion CV - RRR/normal S1 and S2; no murmurs, rubs or gallops;  PMI nondisplaced Abdomen -NT/ND, no HSM, no mass, + bowel sounds, no bruit 2+ femoral pulses, no bruits Ext-no edema, chords, 2+ DP Neuro-grossly nonfocal  ECG - 02/24/2016-Sinus tachycardia with nonspecific ST changes.  A/P  1 chest pain-symptoms are atypical. However I have discussed initiating an exercise program with the patient. I will arrange an exercise treadmill for risk stratification prior to initiation.  2 hypertension-his blood pressure is mildly elevated. I have asked him to track this and additional medications can be added by primary care as needed.  3  hyperlipidemia-continue present medications.  4 obesity-we discussed the importance of exercise, diet and weight loss.  Kerry MillersBrian Crenshaw, MD

## 2016-03-06 ENCOUNTER — Ambulatory Visit (INDEPENDENT_AMBULATORY_CARE_PROVIDER_SITE_OTHER): Payer: 59 | Admitting: Cardiology

## 2016-03-06 ENCOUNTER — Encounter: Payer: Self-pay | Admitting: Cardiology

## 2016-03-06 VITALS — BP 152/82 | HR 98 | Ht 72.0 in | Wt 319.0 lb

## 2016-03-06 DIAGNOSIS — I10 Essential (primary) hypertension: Secondary | ICD-10-CM | POA: Diagnosis not present

## 2016-03-06 DIAGNOSIS — R072 Precordial pain: Secondary | ICD-10-CM

## 2016-03-06 DIAGNOSIS — E7800 Pure hypercholesterolemia, unspecified: Secondary | ICD-10-CM | POA: Diagnosis not present

## 2016-03-06 NOTE — Patient Instructions (Signed)
Testing/Procedures:  Your physician has requested that you have an exercise tolerance test. For further information please visit [email or web address]. Please also follow instruction sheet, as given.    Follow-Up:  Your physician recommends that you schedule a follow-up appointment in: AS NEEDED PENDING TEST RESULTS    Exercise Stress Electrocardiogram An exercise stress electrocardiogram is a test to check how blood flows to your heart. It is done to find areas of poor blood flow. You will need to walk on a treadmill for this test. The electrocardiogram will record your heartbeat when you are at rest and when you are exercising. BEFORE THE PROCEDURE  Do not have drinks with caffeine or foods with caffeine for 24 hours before the test, or as told by your doctor. This includes coffee, tea (even decaf tea), sodas, chocolate, and cocoa.  Follow your doctor's instructions about eating and drinking before the test.  Ask your doctor what medicines you should or should not take before the test. Take your medicines with water unless told by your doctor not to.  If you use an inhaler, bring it with you to the test.  Bring a snack to eat after the test.  Do not  smoke for 4 hours before the test.  Do not put lotions, powders, creams, or oils on your chest before the test.  Wear comfortable shoes and clothing. PROCEDURE  You will have patches put on your chest. Small areas of your chest may need to be shaved. Wires will be connected to the patches.  Your heart rate will be watched while you are resting and while you are exercising.  You will walk on the treadmill. The treadmill will slowly get faster to raise your heart rate.  The test will take about 1-2 hours. AFTER THE PROCEDURE  Your heart rate and blood pressure will be watched after the test.  You may return to your normal diet, activities, and medicines or as told by your doctor.   This information is not intended to replace  advice given to you by your health care provider. Make sure you discuss any questions you have with your health care provider.   Document Released: 10/04/2007 Document Revised: 05/08/2014 Document Reviewed: 12/23/2012 Elsevier Interactive Patient Education 2016 [employer]vier Inc.    

## 2016-03-15 ENCOUNTER — Telehealth (HOSPITAL_COMMUNITY): Payer: Self-pay

## 2016-03-15 DIAGNOSIS — M79602 Pain in left arm: Secondary | ICD-10-CM | POA: Diagnosis not present

## 2016-03-15 MED FILL — predniSONE 20 MG TABS: 20 | 9 days supply | Qty: 18 | Fill #0

## 2016-03-15 NOTE — Telephone Encounter (Signed)
Encounter complete. 

## 2016-03-17 ENCOUNTER — Ambulatory Visit (HOSPITAL_COMMUNITY)
Admission: RE | Admit: 2016-03-17 | Discharge: 2016-03-17 | Disposition: A | Discharge status: Home / Self Care | Payer: 59 | Source: Ambulatory Visit | Attending: Cardiovascular Disease | Admitting: Cardiovascular Disease

## 2016-03-17 DIAGNOSIS — R072 Precordial pain: Secondary | ICD-10-CM | POA: Diagnosis not present

## 2016-03-17 LAB — EXERCISE TOLERANCE TEST
CHL CUP MPHR: 171 {beats}/min
CHL RATE OF PERCEIVED EXERTION: 17
CSEPED: 9 min
CSEPHR: 112 %
Estimated workload: 10.1 METS
Exercise duration (sec): 0 s
Peak HR: 193 {beats}/min
Rest HR: 113 {beats}/min

## 2016-04-25 DIAGNOSIS — K219 Gastro-esophageal reflux disease without esophagitis: Secondary | ICD-10-CM | POA: Diagnosis not present

## 2016-04-25 DIAGNOSIS — E139 Other specified diabetes mellitus without complications: Secondary | ICD-10-CM | POA: Diagnosis not present

## 2016-04-25 DIAGNOSIS — E7800 Pure hypercholesterolemia, unspecified: Secondary | ICD-10-CM | POA: Diagnosis not present

## 2016-04-25 DIAGNOSIS — I10 Essential (primary) hypertension: Secondary | ICD-10-CM | POA: Diagnosis not present

## 2016-04-25 MED FILL — ESOMEPRAZOLE MAG DR 40 MG C: 40 | 90 days supply | Qty: 90 | Fill #0

## 2016-04-25 MED FILL — ROSUVASTATIN CALCIUM 40 MG: 40 | 90 days supply | Qty: 90 | Fill #0

## 2016-04-25 MED FILL — LOSARTAN-HCTZ 100-25 MG TAB: 100-25 | 90 days supply | Qty: 90 | Fill #0

## 2016-04-25 MED FILL — EZETIMIBE 10 MG TABLET: 10 | 90 days supply | Qty: 90 | Fill #0

## 2016-05-12 MED FILL — metFORMIN HCL 500 MG TABS: 500 | 30 days supply | Qty: 60 | Fill #0

## 2016-05-22 DIAGNOSIS — H524 Presbyopia: Secondary | ICD-10-CM | POA: Diagnosis not present

## 2016-05-30 ENCOUNTER — Ambulatory Visit (HOSPITAL_COMMUNITY)
Admission: EM | Admit: 2016-05-30 | Discharge: 2016-05-30 | Disposition: A | Discharge status: Home / Self Care | Payer: 59 | Attending: Family Medicine | Admitting: Family Medicine

## 2016-05-30 ENCOUNTER — Encounter (HOSPITAL_COMMUNITY): Payer: Self-pay | Admitting: Emergency Medicine

## 2016-05-30 DIAGNOSIS — J0140 Acute pansinusitis, unspecified: Secondary | ICD-10-CM | POA: Diagnosis not present

## 2016-05-30 DIAGNOSIS — R05 Cough: Secondary | ICD-10-CM

## 2016-05-30 DIAGNOSIS — R059 Cough, unspecified: Secondary | ICD-10-CM

## 2016-05-30 MED ORDER — BENZONATATE 100 MG PO CAPS: 100.0000 mg | ORAL_CAPSULE | Freq: Three times a day (TID) | ORAL | 0 refills | Status: DC

## 2016-05-30 MED ORDER — AMOXICILLIN-POT CLAVULANATE ER 1000-62.5 MG PO TB12: 1.0000 | ORAL_TABLET | Freq: Two times a day (BID) | ORAL | 0 refills | Status: DC

## 2016-05-30 MED FILL — AMOX-CLAV 875-125 MG TABLET: 875-125 | 7 days supply | Qty: 14 | Fill #0

## 2016-05-30 MED FILL — BENZONATATE 100 MG CAP: 100 | 7 days supply | Qty: 21 | Fill #0

## 2016-05-30 NOTE — ED Triage Notes (Signed)
Pt has been suffering from nasal congestion and a cough for 10 days.  He reports his first fever on Sunday at 100.3.  He states he had the same fever this morning.  Pt has tried Mucinex with some relief.

## 2016-05-30 NOTE — ED Provider Notes (Signed)
CSN: [id number]     Arrival date & time 05/30/16  4098 History   First MD Initiated Contact with Patient 05/30/16 1027     Chief Complaint  Patient presents with  . URI  . Fever   (Consider location/radiation/quality/duration/timing/severity/associated sxs/prior Treatment) 51 year old male presents with chief complaint of sinus congestion, sinus pain, and tenderness since 05/18/16, Reports over the past week he has also developed cough and started running a fever over the weekend. He also reports dental pain starting yesterday as well. Mucous is green, no visible blood. Cough is dry, hacking, non-productive. He is not a smoker, denies history of lung disease.   The history is provided by the patient.  URI  Presenting symptoms: fever   Fever    Past Medical History:  Diagnosis Date  . Chest pain   . GERD (gastroesophageal reflux disease)   . Hyperlipidemia   . Hypertension   . Prediabetes   . Sleep apnea 2010   does not use Cpap   Past Surgical History:  Procedure Laterality Date  . CARDIAC CATHETERIZATION  2010   Dr Allyson Sabal   . CORRECTION HAMMER TOE Left   . FOOT SURGERY     bone spur removal  . HERNIA REPAIR    . LIPOMA EXCISION     x2  . SHOULDER ARTHROSCOPY WITH DISTAL CLAVICLE RESECTION Left 09/17/2012   Procedure: SHOULDER ARTHROSCOPY WITH DISTAL CLAVICLE RESECTION;  Surgeon: Valeria Batman, MD;  Location: [facility] OR;  Service: Orthopedics;  Laterality: Left;  Left shoulder arthroscopy, subacromial decompression, distal clavicle resection.  Marland Kitchen UMBILICAL HERNIA REPAIR     Family History  Problem Relation Age of Onset  . Asthma Mother   . Depression Mother   . Hypertension Brother   . Seizures Brother    Social History  Substance Use Topics  . Smoking status: Former Smoker    Packs/day: 0.50    Years: 25.00    Types: Cigarettes    Quit date: 09/21/2013  . Smokeless tobacco: Never Used  . Alcohol use 3.6 oz/week    6 Cans of beer per week    Review of Systems   Reason unable to perform ROS: as covered in HPI.  Constitutional: Positive for fever.  All other systems reviewed and are negative.   Allergies  Patient has no known allergies.  Home Medications   Prior to Admission medications   Medication Sig Start Date End Date Taking? Authorizing Provider  dextromethorphan-guaiFENesin (MUCINEX DM) 30-600 MG 12hr tablet Take 1 tablet by mouth 2 (two) times daily.   Yes Historical Provider, MD  esomeprazole (NEXIUM) 40 MG capsule Take 40 mg by mouth daily before breakfast.   Yes Historical Provider, MD  ezetimibe (ZETIA) 10 MG tablet Take 10 mg by mouth daily.   Yes Historical Provider, MD  losartan-hydrochlorothiazide (HYZAAR) 100-25 MG per tablet Take 1 tablet by mouth daily.   Yes Historical Provider, MD  metFORMIN (GLUCOPHAGE) 500 MG tablet Take 500 mg by mouth 2 (two) times daily with a meal.   Yes Historical Provider, MD  rosuvastatin (CRESTOR) 40 MG tablet Take 40 mg by mouth daily.   Yes Historical Provider, MD  amoxicillin-clavulanate (AUGMENTIN XR) 1000-62.5 MG 12 hr tablet Take 1 tablet by mouth 2 (two) times daily. 05/30/16   Dorena Bodo, NP  benzonatate (TESSALON) 100 MG capsule Take 1 capsule (100 mg total) by mouth every 8 (eight) hours. 05/30/16   Dorena Bodo, NP   Meds Ordered and Administered this Visit  Medications - No data to display  BP 149/87 (BP Location: Right Arm)   Pulse 114   Temp 99 F (37.2 C) (Oral)   SpO2 96%  No data found.   Physical Exam  Constitutional: He is oriented to person, place, and time. He appears well-developed and well-nourished. No distress.  HENT:  Head: Normocephalic and atraumatic.  Right Ear: Tympanic membrane and external ear normal.  Left Ear: Tympanic membrane and external ear normal.  Nose: Rhinorrhea present. Right sinus exhibits maxillary sinus tenderness. Left sinus exhibits maxillary sinus tenderness.  Mouth/Throat: Uvula is midline, oropharynx is clear and moist and mucous  membranes are normal.  Eyes: Pupils are equal, round, and reactive to light.  Neck: Normal range of motion. Neck supple.  Cardiovascular: Normal rate and regular rhythm.   Pulmonary/Chest: Effort normal and breath sounds normal.  Abdominal: Soft. Bowel sounds are normal.  Neurological: He is alert and oriented to person, place, and time.  Skin: Skin is warm and dry. Capillary refill takes less than 2 seconds. He is not diaphoretic.  Psychiatric: He has a normal mood and affect.  Nursing note and vitals reviewed.   Urgent Care Course     Procedures (including critical care time)  Labs Review Labs Reviewed - No data to display  Imaging Review No results found.   Visual Acuity Review  Right Eye Distance:   Left Eye Distance:   Bilateral Distance:    Right Eye Near:   Left Eye Near:    Bilateral Near:         MDM   1. Acute non-recurrent pansinusitis   2. Cough   You have acute sinusitis. I have prescribed Augmentin as an antibiotic, take 1 tablet twice a day for 7 days. For your cough I have prescribed Tessalon, take 1 tablet every 8 hours as needed. For fever tylenol every 4 hours, not to exceed 4000 mg in a day. For congestion, Flonase 2 sprays each nares once a day, and pseudoephedrine as needed. Should your symptoms persist or fail to improve follow up with your primary care provider or return to clinic as needed.      Dorena BodoLawrence Jayzen Paver, NP 05/30/16 1043

## 2016-05-30 NOTE — Discharge Instructions (Signed)
You have acute sinusitis. I have prescribed Augmentin as an antibiotic, take 1 tablet twice a day for 7 days. For your cough I have prescribed Tessalon, take 1 tablet every 8 hours as needed. For fever tylenol every 4 hours, not to exceed 4000 mg in a day. For congestion, Flonase 2 sprays each nares once a day, and pseudoephedrine as needed. Should your symptoms persist or fail to improve follow up with your primary care provider or return to clinic as needed.

## 2016-06-14 ENCOUNTER — Encounter: Payer: Self-pay | Admitting: Family Medicine

## 2016-06-14 ENCOUNTER — Ambulatory Visit (INDEPENDENT_AMBULATORY_CARE_PROVIDER_SITE_OTHER): Payer: 59 | Admitting: Family Medicine

## 2016-06-14 DIAGNOSIS — M5432 Sciatica, left side: Secondary | ICD-10-CM

## 2016-06-14 MED ORDER — PREDNISONE 10 MG PO TABS: ORAL_TABLET | ORAL | 0 refills | Status: DC

## 2016-06-14 MED FILL — predniSONE 10 MG TABS: 10 | 6 days supply | Qty: 21 | Fill #0

## 2016-06-19 DIAGNOSIS — M25552 Pain in left hip: Secondary | ICD-10-CM | POA: Insufficient documentation

## 2016-06-19 NOTE — Assessment & Plan Note (Signed)
The pain seems to be most consistent with a disc pathology. Doesn't seem to be piriformis.  - prednisone for 6 day course  - provided with extension exercises  - he will follow up in 4 weeks. If no improvement consider x-rays, formal PT, vs MRI.

## 2016-06-19 NOTE — Progress Notes (Signed)
  Kerry BoronCurtis L Nelson - 51 y.o. male MRN [id number]  Date of birth: 05-Dec-1965  SUBJECTIVE:  Including CC & ROS.   Mr. Kerry Nelson is a 51 yo M that is presenting with left buttock pain. The pain has been present for 2 weeks. He has pain that starts in his left buttock and radiates distally down the back of his left leg. The pain is worse with going up or down stairs. He felt the pain after working out. He usually feels the pain after class. He denies any weakness. He feels like he has had some improvement recently. He has a history of low back pain.   ROS: No unexpected weight loss, fever, chills, swelling, instability, numbness/tingling, redness, otherwise see HPI     HISTORY: Past Medical, Surgical, Social, and Family History Reviewed & Updated per EMR.   Pertinent Historical Findings include: PMSHx -  Shoulder impingement, sleep apnea   PSHx -  Former smoker  FHx -  HTN, asthma   DATA REVIEWED: None to review   PHYSICAL EXAM:  VS: BP:(!) 159/98  HR: bpm  TEMP: ( )  RESP:   HT:6' (182.9 cm)   WT:(!) 320 lb (145.2 kg)  BMI:43.5 PHYSICAL EXAM: Gen: NAD, alert, cooperative with exam, well-appearing HEENT: clear conjunctiva, EOMI CV:  no edema, capillary refill brisk,  Resp: non-labored, normal speech Skin: no rashes, normal turgor  Neuro: no gross deficits.  Psych:  alert and oriented Back:  No tenderness to palpation over the lumbar spine, paraspinal muscles, or SI joint. No tenderness to palpation over the piriformis or greater trochanter. Normal internal and external rotation of the hip. Normal hip flexion. Normal knee flexion and extension. Normal strength in lower extremity. +2 deep tendon reflexes patellar Positive straight leg raise on the left No pain with left foot in front with back flexion.  Neurovascularly intact    ASSESSMENT & PLAN:   Sciatic pain The pain seems to be most consistent with a disc pathology. Doesn't seem to be piriformis.  - prednisone for 6 day course    - provided with extension exercises  - he will follow up in 4 weeks. If no improvement consider x-rays, formal PT, vs MRI.

## 2016-07-26 ENCOUNTER — Ambulatory Visit: Payer: 59 | Admitting: Family Medicine

## 2016-08-02 ENCOUNTER — Encounter: Payer: Self-pay | Admitting: Family Medicine

## 2016-08-02 ENCOUNTER — Ambulatory Visit (INDEPENDENT_AMBULATORY_CARE_PROVIDER_SITE_OTHER): Payer: 59 | Admitting: Family Medicine

## 2016-08-02 VITALS — BP 152/89 | HR 99 | Ht 72.0 in | Wt 316.0 lb

## 2016-08-02 DIAGNOSIS — M25552 Pain in left hip: Secondary | ICD-10-CM

## 2016-08-02 MED ORDER — GABAPENTIN 100 MG PO CAPS: 100.0000 mg | ORAL_CAPSULE | Freq: Every day | ORAL | 1 refills | Status: DC

## 2016-08-02 MED FILL — GABAPENTIN 100 MG CAPSULE: 100 | 30 days supply | Qty: 30 | Fill #0

## 2016-08-02 NOTE — Progress Notes (Signed)
  Kerry Nelson - 51 y.o. male MRN [id number]  Date of birth: [birth year]  SUBJECTIVE:  Including CC & ROS.   Kerry Nelson is a 51 year old male that is following up for his left buttock and hip pain. He was provided some home exercises as had some improvement of his pain. He still feels pain in his left buttock with prolonged sitting or rising from a seated position. He was given prednisone last time and has not noticed much of an improvement in his pain. He has been working out with a Systems analyst in order to lose weight and does not seem to have pain with any of his working out exercises. He denies any numbness or weakness in his foot. He has some pain on the lateral aspect of his left hip but does not seem to radiate. He feels like this pain has been occurring for about a year now. He is not having any formal x-rays are MRI of his hip or back. He has not undergone any physical therapy.  ROS: No unexpected weight loss, fever, chills, swelling, instability, numbness/tingling, redness, otherwise see HPI    HISTORY: Past Medical, Surgical, Social, and Family History Reviewed & Updated per EMR.   Pertinent Historical Findings include: PMSHx -  shoulder impingement, sleep apnea  PSHx -  former smoker   DATA REVIEWED: None  PHYSICAL EXAM:  VS: BP:(!) 152/89  HR:99bpm  TEMP: ( )  RESP:   HT:6' (182.9 cm)   WT:(!) 316 lb (143.3 kg)  BMI:42.9 PHYSICAL EXAM: Gen: NAD, alert, cooperative with exam, well-appearing HEENT: clear conjunctiva, EOMI CV:  no edema, capillary refill brisk,  Resp: non-labored, normal speech Skin: no rashes, normal turgor  Neuro: no gross deficits.  Psych:  alert and oriented Back/hip: Some tenderness to palpation over the left gluteal maximus near the iliac crest. No significant tenderness to palpation over the piriformis or SI joints. No tenderness to palpation of the paraspinal muscles or lumbar spine. No significant tenderness to palpation over the greater  trochanter Normal strength with hip flexion. Normal internal and external rotation of the hip. Normal knee flexion and extension. Normal strength in lower extremity. Negative straight leg raise. Normal FADIR and FABER.  Neurovascular intact  ASSESSMENT & PLAN:   Left hip pain It appears that his pain is more related to the musculature around his posterior hip. Prednisone did not help him at all and does not seem to be as quite evident of a disc pathology at this point. Does not have any problems with exercising but does have problems rising from a seated position. - X-ray of his lumbar spine and left hip - Start gabapentin 100 mg daily at bedtime - We'll consider physical therapy going forward. May need to consider an MRI of his left hip versus lumbar spine.

## 2016-08-02 NOTE — Assessment & Plan Note (Signed)
It appears that his pain is more related to the musculature around his posterior hip. Prednisone did not help him at all and does not seem to be as quite evident of a disc pathology at this point. Does not have any problems with exercising but does have problems rising from a seated position. - X-ray of his lumbar spine and left hip - Start gabapentin 100 mg daily at bedtime - We'll consider physical therapy going forward. May need to consider an MRI of his left hip versus lumbar spine.

## 2016-08-03 ENCOUNTER — Ambulatory Visit
Admission: RE | Admit: 2016-08-03 | Discharge: 2016-08-03 | Disposition: A | Discharge status: Home / Self Care | Payer: 59 | Source: Ambulatory Visit | Attending: Sports Medicine | Admitting: Sports Medicine

## 2016-08-03 DIAGNOSIS — M1612 Unilateral primary osteoarthritis, left hip: Secondary | ICD-10-CM | POA: Diagnosis not present

## 2016-08-03 DIAGNOSIS — M47816 Spondylosis without myelopathy or radiculopathy, lumbar region: Secondary | ICD-10-CM | POA: Diagnosis not present

## 2016-08-03 DIAGNOSIS — M25552 Pain in left hip: Secondary | ICD-10-CM

## 2016-08-03 MED FILL — metFORMIN HCL 500 MG TABS: 500 | 30 days supply | Qty: 60 | Fill #1

## 2016-08-10 ENCOUNTER — Telehealth: Payer: Self-pay | Admitting: Family Medicine

## 2016-08-10 NOTE — Telephone Encounter (Signed)
Spoke with patient about his results.   Myra Rude, MD PGY-4, [facility] 08/10/2016, 1:38 PM

## 2016-08-17 DIAGNOSIS — M62830 Muscle spasm of back: Secondary | ICD-10-CM | POA: Diagnosis not present

## 2016-08-17 DIAGNOSIS — M9902 Segmental and somatic dysfunction of thoracic region: Secondary | ICD-10-CM | POA: Diagnosis not present

## 2016-08-17 DIAGNOSIS — M9903 Segmental and somatic dysfunction of lumbar region: Secondary | ICD-10-CM | POA: Diagnosis not present

## 2016-08-17 DIAGNOSIS — M5136 Other intervertebral disc degeneration, lumbar region: Secondary | ICD-10-CM | POA: Diagnosis not present

## 2016-08-17 DIAGNOSIS — M9906 Segmental and somatic dysfunction of lower extremity: Secondary | ICD-10-CM | POA: Diagnosis not present

## 2016-08-17 DIAGNOSIS — M9904 Segmental and somatic dysfunction of sacral region: Secondary | ICD-10-CM | POA: Diagnosis not present

## 2016-08-17 DIAGNOSIS — M25552 Pain in left hip: Secondary | ICD-10-CM | POA: Diagnosis not present

## 2016-08-17 DIAGNOSIS — M5416 Radiculopathy, lumbar region: Secondary | ICD-10-CM | POA: Diagnosis not present

## 2016-08-18 DIAGNOSIS — M5136 Other intervertebral disc degeneration, lumbar region: Secondary | ICD-10-CM | POA: Diagnosis not present

## 2016-08-18 DIAGNOSIS — M62830 Muscle spasm of back: Secondary | ICD-10-CM | POA: Diagnosis not present

## 2016-08-18 DIAGNOSIS — M9904 Segmental and somatic dysfunction of sacral region: Secondary | ICD-10-CM | POA: Diagnosis not present

## 2016-08-18 DIAGNOSIS — M5416 Radiculopathy, lumbar region: Secondary | ICD-10-CM | POA: Diagnosis not present

## 2016-08-18 DIAGNOSIS — M9902 Segmental and somatic dysfunction of thoracic region: Secondary | ICD-10-CM | POA: Diagnosis not present

## 2016-08-18 DIAGNOSIS — M9906 Segmental and somatic dysfunction of lower extremity: Secondary | ICD-10-CM | POA: Diagnosis not present

## 2016-08-18 DIAGNOSIS — M25552 Pain in left hip: Secondary | ICD-10-CM | POA: Diagnosis not present

## 2016-08-18 DIAGNOSIS — M9903 Segmental and somatic dysfunction of lumbar region: Secondary | ICD-10-CM | POA: Diagnosis not present

## 2016-08-21 DIAGNOSIS — M9903 Segmental and somatic dysfunction of lumbar region: Secondary | ICD-10-CM | POA: Diagnosis not present

## 2016-08-21 DIAGNOSIS — M62830 Muscle spasm of back: Secondary | ICD-10-CM | POA: Diagnosis not present

## 2016-08-21 DIAGNOSIS — M9906 Segmental and somatic dysfunction of lower extremity: Secondary | ICD-10-CM | POA: Diagnosis not present

## 2016-08-21 DIAGNOSIS — M9902 Segmental and somatic dysfunction of thoracic region: Secondary | ICD-10-CM | POA: Diagnosis not present

## 2016-08-21 DIAGNOSIS — M5416 Radiculopathy, lumbar region: Secondary | ICD-10-CM | POA: Diagnosis not present

## 2016-08-21 DIAGNOSIS — M5136 Other intervertebral disc degeneration, lumbar region: Secondary | ICD-10-CM | POA: Diagnosis not present

## 2016-08-21 DIAGNOSIS — M25552 Pain in left hip: Secondary | ICD-10-CM | POA: Diagnosis not present

## 2016-08-21 DIAGNOSIS — M9904 Segmental and somatic dysfunction of sacral region: Secondary | ICD-10-CM | POA: Diagnosis not present

## 2016-08-23 DIAGNOSIS — M9904 Segmental and somatic dysfunction of sacral region: Secondary | ICD-10-CM | POA: Diagnosis not present

## 2016-08-23 DIAGNOSIS — M5416 Radiculopathy, lumbar region: Secondary | ICD-10-CM | POA: Diagnosis not present

## 2016-08-23 DIAGNOSIS — M9903 Segmental and somatic dysfunction of lumbar region: Secondary | ICD-10-CM | POA: Diagnosis not present

## 2016-08-23 DIAGNOSIS — M9906 Segmental and somatic dysfunction of lower extremity: Secondary | ICD-10-CM | POA: Diagnosis not present

## 2016-08-23 DIAGNOSIS — M25552 Pain in left hip: Secondary | ICD-10-CM | POA: Diagnosis not present

## 2016-08-23 DIAGNOSIS — M5136 Other intervertebral disc degeneration, lumbar region: Secondary | ICD-10-CM | POA: Diagnosis not present

## 2016-08-23 DIAGNOSIS — M62830 Muscle spasm of back: Secondary | ICD-10-CM | POA: Diagnosis not present

## 2016-08-23 DIAGNOSIS — M9902 Segmental and somatic dysfunction of thoracic region: Secondary | ICD-10-CM | POA: Diagnosis not present

## 2016-08-24 DIAGNOSIS — M5416 Radiculopathy, lumbar region: Secondary | ICD-10-CM | POA: Diagnosis not present

## 2016-08-24 DIAGNOSIS — M25552 Pain in left hip: Secondary | ICD-10-CM | POA: Diagnosis not present

## 2016-08-24 DIAGNOSIS — M62830 Muscle spasm of back: Secondary | ICD-10-CM | POA: Diagnosis not present

## 2016-08-24 DIAGNOSIS — M9902 Segmental and somatic dysfunction of thoracic region: Secondary | ICD-10-CM | POA: Diagnosis not present

## 2016-08-24 DIAGNOSIS — M9906 Segmental and somatic dysfunction of lower extremity: Secondary | ICD-10-CM | POA: Diagnosis not present

## 2016-08-24 DIAGNOSIS — M9904 Segmental and somatic dysfunction of sacral region: Secondary | ICD-10-CM | POA: Diagnosis not present

## 2016-08-24 DIAGNOSIS — M9903 Segmental and somatic dysfunction of lumbar region: Secondary | ICD-10-CM | POA: Diagnosis not present

## 2016-08-24 DIAGNOSIS — M5136 Other intervertebral disc degeneration, lumbar region: Secondary | ICD-10-CM | POA: Diagnosis not present

## 2016-08-29 DIAGNOSIS — M9902 Segmental and somatic dysfunction of thoracic region: Secondary | ICD-10-CM | POA: Diagnosis not present

## 2016-08-29 DIAGNOSIS — M5136 Other intervertebral disc degeneration, lumbar region: Secondary | ICD-10-CM | POA: Diagnosis not present

## 2016-08-29 DIAGNOSIS — M9904 Segmental and somatic dysfunction of sacral region: Secondary | ICD-10-CM | POA: Diagnosis not present

## 2016-08-29 DIAGNOSIS — M25552 Pain in left hip: Secondary | ICD-10-CM | POA: Diagnosis not present

## 2016-08-29 DIAGNOSIS — M9906 Segmental and somatic dysfunction of lower extremity: Secondary | ICD-10-CM | POA: Diagnosis not present

## 2016-08-29 DIAGNOSIS — M9903 Segmental and somatic dysfunction of lumbar region: Secondary | ICD-10-CM | POA: Diagnosis not present

## 2016-08-29 DIAGNOSIS — M62830 Muscle spasm of back: Secondary | ICD-10-CM | POA: Diagnosis not present

## 2016-08-29 DIAGNOSIS — M5416 Radiculopathy, lumbar region: Secondary | ICD-10-CM | POA: Diagnosis not present

## 2016-08-30 DIAGNOSIS — M5136 Other intervertebral disc degeneration, lumbar region: Secondary | ICD-10-CM | POA: Diagnosis not present

## 2016-08-30 DIAGNOSIS — M5416 Radiculopathy, lumbar region: Secondary | ICD-10-CM | POA: Diagnosis not present

## 2016-08-30 DIAGNOSIS — M9906 Segmental and somatic dysfunction of lower extremity: Secondary | ICD-10-CM | POA: Diagnosis not present

## 2016-08-30 DIAGNOSIS — M9903 Segmental and somatic dysfunction of lumbar region: Secondary | ICD-10-CM | POA: Diagnosis not present

## 2016-08-30 DIAGNOSIS — M62830 Muscle spasm of back: Secondary | ICD-10-CM | POA: Diagnosis not present

## 2016-08-30 DIAGNOSIS — M25552 Pain in left hip: Secondary | ICD-10-CM | POA: Diagnosis not present

## 2016-08-30 DIAGNOSIS — M9904 Segmental and somatic dysfunction of sacral region: Secondary | ICD-10-CM | POA: Diagnosis not present

## 2016-08-30 DIAGNOSIS — M9902 Segmental and somatic dysfunction of thoracic region: Secondary | ICD-10-CM | POA: Diagnosis not present

## 2016-09-01 DIAGNOSIS — M9903 Segmental and somatic dysfunction of lumbar region: Secondary | ICD-10-CM | POA: Diagnosis not present

## 2016-09-01 DIAGNOSIS — M5136 Other intervertebral disc degeneration, lumbar region: Secondary | ICD-10-CM | POA: Diagnosis not present

## 2016-09-01 DIAGNOSIS — M9906 Segmental and somatic dysfunction of lower extremity: Secondary | ICD-10-CM | POA: Diagnosis not present

## 2016-09-01 DIAGNOSIS — M25552 Pain in left hip: Secondary | ICD-10-CM | POA: Diagnosis not present

## 2016-09-01 DIAGNOSIS — M9902 Segmental and somatic dysfunction of thoracic region: Secondary | ICD-10-CM | POA: Diagnosis not present

## 2016-09-01 DIAGNOSIS — M5416 Radiculopathy, lumbar region: Secondary | ICD-10-CM | POA: Diagnosis not present

## 2016-09-01 DIAGNOSIS — M62830 Muscle spasm of back: Secondary | ICD-10-CM | POA: Diagnosis not present

## 2016-09-01 DIAGNOSIS — M9904 Segmental and somatic dysfunction of sacral region: Secondary | ICD-10-CM | POA: Diagnosis not present

## 2016-09-04 DIAGNOSIS — M9903 Segmental and somatic dysfunction of lumbar region: Secondary | ICD-10-CM | POA: Diagnosis not present

## 2016-09-04 DIAGNOSIS — M5416 Radiculopathy, lumbar region: Secondary | ICD-10-CM | POA: Diagnosis not present

## 2016-09-04 DIAGNOSIS — M9906 Segmental and somatic dysfunction of lower extremity: Secondary | ICD-10-CM | POA: Diagnosis not present

## 2016-09-04 DIAGNOSIS — M62830 Muscle spasm of back: Secondary | ICD-10-CM | POA: Diagnosis not present

## 2016-09-04 DIAGNOSIS — M5136 Other intervertebral disc degeneration, lumbar region: Secondary | ICD-10-CM | POA: Diagnosis not present

## 2016-09-04 DIAGNOSIS — M25552 Pain in left hip: Secondary | ICD-10-CM | POA: Diagnosis not present

## 2016-09-04 DIAGNOSIS — M9902 Segmental and somatic dysfunction of thoracic region: Secondary | ICD-10-CM | POA: Diagnosis not present

## 2016-09-04 DIAGNOSIS — M9904 Segmental and somatic dysfunction of sacral region: Secondary | ICD-10-CM | POA: Diagnosis not present

## 2016-09-06 DIAGNOSIS — M9902 Segmental and somatic dysfunction of thoracic region: Secondary | ICD-10-CM | POA: Diagnosis not present

## 2016-09-06 DIAGNOSIS — M9904 Segmental and somatic dysfunction of sacral region: Secondary | ICD-10-CM | POA: Diagnosis not present

## 2016-09-06 DIAGNOSIS — M25552 Pain in left hip: Secondary | ICD-10-CM | POA: Diagnosis not present

## 2016-09-06 DIAGNOSIS — M5416 Radiculopathy, lumbar region: Secondary | ICD-10-CM | POA: Diagnosis not present

## 2016-09-06 DIAGNOSIS — M5136 Other intervertebral disc degeneration, lumbar region: Secondary | ICD-10-CM | POA: Diagnosis not present

## 2016-09-06 DIAGNOSIS — M9906 Segmental and somatic dysfunction of lower extremity: Secondary | ICD-10-CM | POA: Diagnosis not present

## 2016-09-06 DIAGNOSIS — M9903 Segmental and somatic dysfunction of lumbar region: Secondary | ICD-10-CM | POA: Diagnosis not present

## 2016-09-06 DIAGNOSIS — M62830 Muscle spasm of back: Secondary | ICD-10-CM | POA: Diagnosis not present

## 2016-09-07 DIAGNOSIS — M5136 Other intervertebral disc degeneration, lumbar region: Secondary | ICD-10-CM | POA: Diagnosis not present

## 2016-09-07 DIAGNOSIS — M9904 Segmental and somatic dysfunction of sacral region: Secondary | ICD-10-CM | POA: Diagnosis not present

## 2016-09-07 DIAGNOSIS — M25552 Pain in left hip: Secondary | ICD-10-CM | POA: Diagnosis not present

## 2016-09-07 DIAGNOSIS — M5416 Radiculopathy, lumbar region: Secondary | ICD-10-CM | POA: Diagnosis not present

## 2016-09-07 DIAGNOSIS — M9903 Segmental and somatic dysfunction of lumbar region: Secondary | ICD-10-CM | POA: Diagnosis not present

## 2016-09-07 DIAGNOSIS — M9906 Segmental and somatic dysfunction of lower extremity: Secondary | ICD-10-CM | POA: Diagnosis not present

## 2016-09-07 DIAGNOSIS — M9902 Segmental and somatic dysfunction of thoracic region: Secondary | ICD-10-CM | POA: Diagnosis not present

## 2016-09-07 DIAGNOSIS — M62830 Muscle spasm of back: Secondary | ICD-10-CM | POA: Diagnosis not present

## 2016-09-11 DIAGNOSIS — M62830 Muscle spasm of back: Secondary | ICD-10-CM | POA: Diagnosis not present

## 2016-09-11 DIAGNOSIS — M9904 Segmental and somatic dysfunction of sacral region: Secondary | ICD-10-CM | POA: Diagnosis not present

## 2016-09-11 DIAGNOSIS — M5416 Radiculopathy, lumbar region: Secondary | ICD-10-CM | POA: Diagnosis not present

## 2016-09-11 DIAGNOSIS — M25552 Pain in left hip: Secondary | ICD-10-CM | POA: Diagnosis not present

## 2016-09-11 DIAGNOSIS — M5136 Other intervertebral disc degeneration, lumbar region: Secondary | ICD-10-CM | POA: Diagnosis not present

## 2016-09-11 DIAGNOSIS — M9906 Segmental and somatic dysfunction of lower extremity: Secondary | ICD-10-CM | POA: Diagnosis not present

## 2016-09-11 DIAGNOSIS — M9902 Segmental and somatic dysfunction of thoracic region: Secondary | ICD-10-CM | POA: Diagnosis not present

## 2016-09-11 DIAGNOSIS — M9903 Segmental and somatic dysfunction of lumbar region: Secondary | ICD-10-CM | POA: Diagnosis not present

## 2016-09-13 DIAGNOSIS — M25552 Pain in left hip: Secondary | ICD-10-CM | POA: Diagnosis not present

## 2016-09-13 DIAGNOSIS — M9906 Segmental and somatic dysfunction of lower extremity: Secondary | ICD-10-CM | POA: Diagnosis not present

## 2016-09-13 DIAGNOSIS — M9904 Segmental and somatic dysfunction of sacral region: Secondary | ICD-10-CM | POA: Diagnosis not present

## 2016-09-13 DIAGNOSIS — M9902 Segmental and somatic dysfunction of thoracic region: Secondary | ICD-10-CM | POA: Diagnosis not present

## 2016-09-13 DIAGNOSIS — M9903 Segmental and somatic dysfunction of lumbar region: Secondary | ICD-10-CM | POA: Diagnosis not present

## 2016-09-13 DIAGNOSIS — M62830 Muscle spasm of back: Secondary | ICD-10-CM | POA: Diagnosis not present

## 2016-09-13 DIAGNOSIS — M5416 Radiculopathy, lumbar region: Secondary | ICD-10-CM | POA: Diagnosis not present

## 2016-09-13 DIAGNOSIS — M5136 Other intervertebral disc degeneration, lumbar region: Secondary | ICD-10-CM | POA: Diagnosis not present

## 2016-09-14 ENCOUNTER — Other Ambulatory Visit: Payer: Self-pay

## 2016-09-14 ENCOUNTER — Encounter (HOSPITAL_COMMUNITY): Payer: Self-pay | Admitting: Emergency Medicine

## 2016-09-14 ENCOUNTER — Emergency Department (HOSPITAL_COMMUNITY): Payer: 59

## 2016-09-14 ENCOUNTER — Emergency Department (HOSPITAL_COMMUNITY)
Admission: EM | Admit: 2016-09-14 | Discharge: 2016-09-14 | Disposition: A | Discharge status: Home / Self Care | Payer: 59 | Attending: Emergency Medicine | Admitting: Emergency Medicine

## 2016-09-14 ENCOUNTER — Ambulatory Visit (INDEPENDENT_AMBULATORY_CARE_PROVIDER_SITE_OTHER)
Admission: EM | Admit: 2016-09-14 | Discharge: 2016-09-14 | Disposition: A | Discharge status: Home / Self Care | Payer: 59 | Source: Home / Self Care | Attending: Internal Medicine | Admitting: Internal Medicine

## 2016-09-14 DIAGNOSIS — G4730 Sleep apnea, unspecified: Secondary | ICD-10-CM | POA: Insufficient documentation

## 2016-09-14 DIAGNOSIS — I10 Essential (primary) hypertension: Secondary | ICD-10-CM

## 2016-09-14 DIAGNOSIS — M25511 Pain in right shoulder: Secondary | ICD-10-CM | POA: Insufficient documentation

## 2016-09-14 DIAGNOSIS — Z87891 Personal history of nicotine dependence: Secondary | ICD-10-CM | POA: Insufficient documentation

## 2016-09-14 DIAGNOSIS — Z7984 Long term (current) use of oral hypoglycemic drugs: Secondary | ICD-10-CM

## 2016-09-14 DIAGNOSIS — E785 Hyperlipidemia, unspecified: Secondary | ICD-10-CM | POA: Insufficient documentation

## 2016-09-14 DIAGNOSIS — K219 Gastro-esophageal reflux disease without esophagitis: Secondary | ICD-10-CM | POA: Insufficient documentation

## 2016-09-14 DIAGNOSIS — Z79899 Other long term (current) drug therapy: Secondary | ICD-10-CM | POA: Insufficient documentation

## 2016-09-14 DIAGNOSIS — R9431 Abnormal electrocardiogram [ECG] [EKG]: Secondary | ICD-10-CM

## 2016-09-14 DIAGNOSIS — I4581 Long QT syndrome: Secondary | ICD-10-CM | POA: Insufficient documentation

## 2016-09-14 DIAGNOSIS — E119 Type 2 diabetes mellitus without complications: Secondary | ICD-10-CM

## 2016-09-14 LAB — I-STAT TROPONIN, ED: TROPONIN I, POC: 0 ng/mL (ref 0.00–0.08)

## 2016-09-14 MED ORDER — ASPIRIN 81 MG PO CHEW
324.0000 mg | CHEWABLE_TABLET | Freq: Once | ORAL | Status: AC
  Administered 2016-09-14: 324 mg via ORAL

## 2016-09-14 MED ORDER — ASPIRIN 81 MG PO CHEW
CHEWABLE_TABLET | ORAL | Status: AC
  Filled 2016-09-14: qty 4

## 2016-09-14 MED ORDER — PREDNISONE 20 MG PO TABS: 40.0000 mg | ORAL_TABLET | Freq: Every day | ORAL | 0 refills | Status: DC

## 2016-09-14 MED FILL — predniSONE 20 MG TABS: 20 | 3 days supply | Qty: 6 | Fill #0

## 2016-09-14 NOTE — ED Notes (Signed)
ED Provider at bedside. 

## 2016-09-14 NOTE — ED Provider Notes (Signed)
[facility]-EMERGENCY DEPT Provider Note   CSN: [id number] Arrival date & time: 09/14/16  1121     History   Chief Complaint Chief Complaint  Patient presents with  . Abnormal ECG    HPI Kerry Nelson is a 51 y.o. male.  HPI Patient presents with right shoulder pain. Began last night and worsened today. Worse with movements. Worse with laying on the shoulder. No fevers. No numbness weakness. No chest pain. No fevers or chills. Has had surgery on the shoulder but now problem the shoulder. 2 days ago he did do some yard work. Seen at urgent care and had nonspecific EKG changes and was sent to the EKG for further evaluation. Past Medical History:  Diagnosis Date  . Chest pain   . GERD (gastroesophageal reflux disease)   . Hyperlipidemia   . Hypertension   . Prediabetes   . Sleep apnea 2010   does not use Cpap    Patient Active Problem List   Diagnosis Date Noted  . Left hip pain 06/19/2016  . ELBOW PAIN, LEFT 08/05/2009  . SHOULDER IMPINGEMENT SYNDROME 08/05/2009  . LATERAL EPICONDYLITIS 08/05/2009  . ANKLE PAIN, LEFT 02/10/2009  . OTHER ACQUIRED DEFORMITY OF ANKLE AND FOOT OTHER 02/10/2009  . OSTEOARTHRITIS, KNEE, RIGHT 03/10/2008  . KNEE PAIN 03/10/2008  . UNEQUAL LEG LENGTH 03/10/2008    Past Surgical History:  Procedure Laterality Date  . CARDIAC CATHETERIZATION  2010   Dr Allyson Sabal   . CORRECTION HAMMER TOE Left   . FOOT SURGERY     bone spur removal  . HERNIA REPAIR    . LIPOMA EXCISION     x2  . SHOULDER ARTHROSCOPY WITH DISTAL CLAVICLE RESECTION Left 09/17/2012   Procedure: SHOULDER ARTHROSCOPY WITH DISTAL CLAVICLE RESECTION;  Surgeon: Valeria Batman, MD;  Location: [facility] OR;  Service: Orthopedics;  Laterality: Left;  Left shoulder arthroscopy, subacromial decompression, distal clavicle resection.  Marland Kitchen UMBILICAL HERNIA REPAIR         Home Medications    Prior to Admission medications   Medication Sig Start Date End Date Taking? Authorizing Provider    acetaminophen (TYLENOL) 500 MG tablet Take 1,000 mg by mouth every 6 (six) hours as needed for moderate pain.   Yes [provider]  esomeprazole (NEXIUM) 40 MG capsule Take 40 mg by mouth daily before breakfast.   Yes [provider]  ezetimibe (ZETIA) 10 MG tablet Take 10 mg by mouth daily.   Yes [provider]  gabapentin (NEURONTIN) 100 MG capsule Take 1 capsule (100 mg total) by mouth at bedtime. 08/02/16  Yes Myra Rude, MD  losartan-hydrochlorothiazide (HYZAAR) 100-25 MG per tablet Take 1 tablet by mouth daily.   Yes [provider]  metFORMIN (GLUCOPHAGE) 500 MG tablet Take 500 mg by mouth daily with breakfast.    Yes [provider]  rosuvastatin (CRESTOR) 40 MG tablet Take 40 mg by mouth daily.   Yes [provider]  predniSONE (DELTASONE) 20 MG tablet Take 2 tablets (40 mg total) by mouth daily. 09/14/16   Benjiman Core, MD    Family History Family History  Problem Relation Age of Onset  . Asthma Mother   . Depression Mother   . Hypertension Brother   . Seizures Brother     Social History Social History  Substance Use Topics  . Smoking status: Former Smoker    Packs/day: 0.50    Years: 25.00    Types: Cigarettes    Quit date: 09/21/2013  .  Smokeless tobacco: Never Used  . Alcohol use 3.6 oz/week    6 Cans of beer per week     Allergies   Patient has no known allergies.   Review of Systems Review of Systems  Constitutional: Negative for appetite change.  HENT: Negative for congestion.   Cardiovascular: Negative for chest pain.  Gastrointestinal: Negative for abdominal pain.  Genitourinary: Negative for flank pain.  Musculoskeletal: Negative for back pain.       Left shoulder pain.  Skin: Negative for wound.  Neurological: Negative for weakness and numbness.  Psychiatric/Behavioral: Negative for confusion.     Physical Exam Updated Vital Signs BP 123/87   Pulse 81   Temp 97.8 F (36.6 C)  (Oral)   Resp 17   Ht 6' (1.829 m)   Wt (!) 320 lb (145.2 kg)   SpO2 99%   BMI 43.40 kg/m   Physical Exam  Constitutional: He appears well-developed.  HENT:  Head: Atraumatic.  Neck: Neck supple.  Cardiovascular: Normal rate.   Pulmonary/Chest: Effort normal.  Abdominal: Soft.  Musculoskeletal:  Pain with movement of the right shoulder. Some tenderness anteriorly. No erythema. Neurovascular intact right hand. Somewhat decreased range of motion due to pain.  Neurological: He is alert.  Skin: Skin is warm. Capillary refill takes less than 2 seconds.  Psychiatric: He has a normal mood and affect.     ED Treatments / Results  Labs (all labs ordered are listed, but only abnormal results are displayed) Labs Reviewed  Rosezen[facility] SensorI-STAT TROPOININ, ED    EKG  EKG Interpretation None       Radiology Dg Shoulder Right  Result Date: 09/14/2016 CLINICAL DATA:  Sharp right shoulder pain EXAM: RIGHT SHOULDER - 2+ VIEW COMPARISON:  None. FINDINGS: Mild degenerative changes [facility] the [facility] with joint space narrowing and spurring. Glenohumeral joint is maintained. No acute bony abnormality. Specifically, no fracture, subluxation, or dislocation. Soft tissues are intact. IMPRESSION: Mild degenerative changes [facility].  No acute findings. Electronically Signed   By: Charlett NoseKevin  Dover M.D.   On: 09/14/2016 12:36    Procedures Procedures (including critical care time)  Medications Ordered in ED Medications - No data to display   Initial Impression / Assessment and Plan / ED Course  I have reviewed the triage vital signs and the nursing notes.  Pertinent labs & imaging results that were available during my care of the patient were reviewed by me and considered in my medical decision making (see chart for details).     Patient with right shoulder pain. Likely musculoskeletal. Pain with movement. Does have history of gout 2. Septic joint felt less likely. Sent from urgent care for nonspecific  EKG changes. Has T-wave inversion that is resolved. No chest pain. Troponin negative. Previous negative cardiac workup. Will discharge home follow-up as needed. Patient is seeing Dr. Cleophas DunkerWhitfield in the past for his other shoulder.  Final Clinical Impressions(s) / ED Diagnoses   Final diagnoses:  Acute pain of right shoulder    New Prescriptions New Prescriptions   PREDNISONE (DELTASONE) 20 MG TABLET    Take 2 tablets (40 mg total) by mouth daily.     Benjiman CorePickering, Vergia Chea, MD 09/14/16 1339

## 2016-09-14 NOTE — ED Provider Notes (Signed)
[facility]    CSN: [id number] Arrival date & time: 09/14/16  [id number]     History   Chief Complaint Chief Complaint  Patient presents with  . Shoulder Pain    HPI Kerry Nelson is a 51 y.o. male. He presents today with unexplained pain in the right shoulder. No unusual activities, no trauma. This awakened him in the night. Pain is worse with extension of the arm overhead. Patient has some cardiac risk factors including hyperlipidemia, hypertension, diabetes, and former smoking. He went for 9 minutes on a treadmill in November, without EKG changes. Because the shoulder pain did not have an obvious trigger, an EKG was performed which demonstrated nonspecific ST and T-wave changes, with some T-wave inversion in the lateral leads which is new since EKG of October 2017.    HPI  Past Medical History:  Diagnosis Date  . Chest pain   . GERD (gastroesophageal reflux disease)   . Hyperlipidemia   . Hypertension   . Prediabetes   . Sleep apnea 2010   does not use Cpap    Patient Active Problem List   Diagnosis Date Noted  . Left hip pain 06/19/2016  . ELBOW PAIN, LEFT 08/05/2009  . SHOULDER IMPINGEMENT SYNDROME 08/05/2009  . LATERAL EPICONDYLITIS 08/05/2009  . ANKLE PAIN, LEFT 02/10/2009  . OTHER ACQUIRED DEFORMITY OF ANKLE AND FOOT OTHER 02/10/2009  . OSTEOARTHRITIS, KNEE, RIGHT 03/10/2008  . KNEE PAIN 03/10/2008  . UNEQUAL LEG LENGTH 03/10/2008    Past Surgical History:  Procedure Laterality Date  . CARDIAC CATHETERIZATION  2010   Dr Allyson SabalBerry   . CORRECTION HAMMER TOE Left   . FOOT SURGERY     bone spur removal  . HERNIA REPAIR    . LIPOMA EXCISION     x2  . SHOULDER ARTHROSCOPY WITH DISTAL CLAVICLE RESECTION Left 09/17/2012   Procedure: SHOULDER ARTHROSCOPY WITH DISTAL CLAVICLE RESECTION;  Surgeon: Valeria BatmanPeter W Whitfield, MD;  [facility];  Service: Orthopedics;  Laterality: Left;  Left shoulder arthroscopy, subacromial decompression, distal clavicle resection.    Marland Kitchen. UMBILICAL HERNIA REPAIR         Home Medications    Prior to Admission medications   Medication Sig Start Date End Date Taking? Authorizing Provider  esomeprazole (NEXIUM) 40 MG capsule Take 40 mg by mouth daily before breakfast.    [provider]  ezetimibe (ZETIA) 10 MG tablet Take 10 mg by mouth daily.    [provider]  gabapentin (NEURONTIN) 100 MG capsule Take 1 capsule (100 mg total) by mouth at bedtime. 08/02/16   Myra RudeSchmitz, Jeremy E, MD  losartan-hydrochlorothiazide (HYZAAR) 100-25 MG per tablet Take 1 tablet by mouth daily.    [provider]  metFORMIN (GLUCOPHAGE) 500 MG tablet Take 500 mg by mouth 2 (two) times daily with a meal.    [provider]  rosuvastatin (CRESTOR) 40 MG tablet Take 40 mg by mouth daily.    [provider]    Family History Family History  Problem Relation Age of Onset  . Asthma Mother   . Depression Mother   . Hypertension Brother   . Seizures Brother     Social History Social History  Substance Use Topics  . Smoking status: Former Smoker    Packs/day: 0.50    Years: 25.00    Types: Cigarettes    Quit date: 09/21/2013  . Smokeless tobacco: Never Used  . Alcohol use 3.6 oz/week    6 Cans of beer per  week     Allergies   Patient has no known allergies.   Review of Systems Review of Systems  All other systems reviewed and are negative.    Physical Exam Triage Vital Signs ED Triage Vitals  Enc Vitals Group     BP 09/14/16 1013 (!) 144/84     Pulse Rate 09/14/16 1013 (!) 101     Resp 09/14/16 1013 18     Temp 09/14/16 1013 98.3 F (36.8 C)     Temp Source 09/14/16 1013 Oral     SpO2 09/14/16 1013 96 %     Weight --      Height --      Head Circumference --      Peak Flow --      Pain Score 09/14/16 1011 8     Pain Loc --      Pain Edu? --      Excl. in [place]? --    No data found.   Updated Vital Signs BP (!) 144/84 (BP Location: Left Arm)   Pulse (!) 101   Temp 98.3  F (36.8 C) (Oral)   Resp 18   SpO2 96%   Visual Acuity Right Eye Distance:   Left Eye Distance:   Bilateral Distance:    Right Eye Near:   Left Eye Near:    Bilateral Near:     Physical Exam  Constitutional: He is oriented to person, place, and time. No distress.  Alert, nicely groomed  HENT:  Head: Atraumatic.  Eyes:  Conjugate gaze, no eye redness/drainage  Neck: Neck supple.  Cardiovascular: Normal rate and regular rhythm.   Soft S4 gallop  Pulmonary/Chest: No respiratory distress. He has no wheezes. He has no rales.  Lungs clear, symmetric breath sounds  Abdominal: He exhibits no distension.  Musculoskeletal: Normal range of motion. He exhibits no edema.  Very painful to raise right arm overhead at shoulder, ext rotate, less painful to int rotate No pain increase with neck rotation, flexion/extension  Neurological: He is alert and oriented to person, place, and time.  Skin: Skin is warm and dry.  No cyanosis  Nursing note and vitals reviewed.    UC Treatments / Results   EKG Compared ECG to ECG of 02/25/16, apparent T wave inversion now in lateral leads compared to that ECG.  Sinus rhythm.  No ST elevation.  Procedures Procedures (including critical care time)  Medications Ordered in UC Medications  aspirin chewable tablet 324 mg (324 mg Oral Given 09/14/16 1041)     Final Clinical Impressions(s) / UC Diagnoses   Final diagnoses:  Abnormal ECG  Acute pain of right shoulder   May have impingement of shoulder but no clear explanation for onset of severe symptoms and new ECG abnormality.  Had ASA at [facility].  To ED for enzymes, consideration of possible further risk stratificiation.   Eustace Moore, MD 09/14/16 1102

## 2016-09-14 NOTE — ED Triage Notes (Signed)
No known injury.  Pain in right shoulder .  Woke patient during the night.  Feels pulling sensation in top and upper back.  Patient has limited range of motion.  Right radial pulse 2+.  No numbness or tingling.  Since getting up, symptoms are not improving, but no better either.

## 2016-09-14 NOTE — ED Triage Notes (Signed)
Pt. Seen at urgent care for shoulder pain today that started this morning. Urgent care noticed abnormal EKG compared to last one on file, patient sent to ED. Patient continues to complain of right shoulder pain, patient denies chest pain and denies shortness of breath.

## 2016-09-14 NOTE — Discharge Instructions (Signed)
Follow-up with orthopedic surgery of the shoulder. Follow-up with your doctor or cardiology again for the nonspecific EKG changes.

## 2016-09-14 NOTE — ED Notes (Signed)
Patient transported to X-ray 

## 2016-11-27 DIAGNOSIS — H40013 Open angle with borderline findings, low risk, bilateral: Secondary | ICD-10-CM | POA: Diagnosis not present

## 2017-01-02 DIAGNOSIS — M109 Gout, unspecified: Secondary | ICD-10-CM | POA: Diagnosis not present

## 2017-01-02 DIAGNOSIS — Z7984 Long term (current) use of oral hypoglycemic drugs: Secondary | ICD-10-CM | POA: Diagnosis not present

## 2017-01-02 DIAGNOSIS — K219 Gastro-esophageal reflux disease without esophagitis: Secondary | ICD-10-CM | POA: Diagnosis not present

## 2017-01-02 DIAGNOSIS — Z125 Encounter for screening for malignant neoplasm of prostate: Secondary | ICD-10-CM | POA: Diagnosis not present

## 2017-01-02 DIAGNOSIS — E7800 Pure hypercholesterolemia, unspecified: Secondary | ICD-10-CM | POA: Diagnosis not present

## 2017-01-02 DIAGNOSIS — E1165 Type 2 diabetes mellitus with hyperglycemia: Secondary | ICD-10-CM | POA: Diagnosis not present

## 2017-01-02 DIAGNOSIS — I10 Essential (primary) hypertension: Secondary | ICD-10-CM | POA: Diagnosis not present

## 2017-01-02 DIAGNOSIS — E139 Other specified diabetes mellitus without complications: Secondary | ICD-10-CM | POA: Diagnosis not present

## 2017-01-02 DIAGNOSIS — Z0000 Encounter for general adult medical examination without abnormal findings: Secondary | ICD-10-CM | POA: Diagnosis not present

## 2017-01-02 MED FILL — EZETIMIBE 10 MG TAB: 10 | 90 days supply | Qty: 90 | Fill #1

## 2017-01-02 MED FILL — LOSARTAN-HCTZ 100-25 MG TAB: 100-25 | 90 days supply | Qty: 90 | Fill #1

## 2017-01-02 MED FILL — metFORMIN HCL 500 MG TABS: 500 | 30 days supply | Qty: 60 | Fill #2

## 2017-01-02 MED FILL — ROSUVASTATIN CALCIUM 40 MG: 40 | 90 days supply | Qty: 90 | Fill #1

## 2017-01-02 MED FILL — ESOMEPRAZOLE MAG DR 40 MG C: 40 | 90 days supply | Qty: 90 | Fill #1

## 2017-01-24 DIAGNOSIS — Z1211 Encounter for screening for malignant neoplasm of colon: Secondary | ICD-10-CM | POA: Diagnosis not present

## 2017-02-15 MED FILL — PEG-3350 SOLUTION: 420 | 1 days supply | Qty: 4000 | Fill #0

## 2017-02-27 MED FILL — metFORMIN HCL 500 MG TABS: 500 | 30 days supply | Qty: 60 | Fill #3

## 2017-03-02 DIAGNOSIS — Z1211 Encounter for screening for malignant neoplasm of colon: Secondary | ICD-10-CM | POA: Diagnosis not present

## 2017-04-13 MED FILL — metFORMIN HCL 500 MG TABS: 500 | 30 days supply | Qty: 60 | Fill #4

## 2017-04-13 MED FILL — EZETIMIBE 10 MG TABS: 10 | 90 days supply | Qty: 90 | Fill #2

## 2017-04-30 MED FILL — LOSARTAN-HCTZ 100-25 MG TAB: 100-25 | 90 days supply | Qty: 90 | Fill #0

## 2017-04-30 MED FILL — ESOMEPRAZOLE MAG DR 40 MG C: 40 | 90 days supply | Qty: 90 | Fill #0

## 2017-04-30 MED FILL — ROSUVASTATIN CALCIUM 40 MG: 40 | 90 days supply | Qty: 90 | Fill #0

## 2017-05-10 MED FILL — PROMETHAZINE W/COD SYRUP: 6.25-10 | 6 days supply | Qty: 120 | Fill #0

## 2017-05-10 MED FILL — SULFAMETHOXAZOLE/TMP DS TAB: 800-160 | 10 days supply | Qty: 20 | Fill #0

## 2017-05-18 MED FILL — metFORMIN HCL 500 MG TABS: 500 | 30 days supply | Qty: 60 | Fill #0

## 2017-05-23 DIAGNOSIS — H524 Presbyopia: Secondary | ICD-10-CM | POA: Diagnosis not present

## 2017-06-19 MED FILL — metFORMIN HCL 500 MG TABS: 500 | 30 days supply | Qty: 60 | Fill #0

## 2017-07-09 MED FILL — metFORMIN HCL 1000 MG TABS: 1000 | 30 days supply | Qty: 60 | Fill #0

## 2017-07-11 MED FILL — JARDIANCE 10 MG TABLET: 10 | 30 days supply | Qty: 30 | Fill #0

## 2017-07-30 MED FILL — EZETIMIBE 10 MG TABLET: 10 | 90 days supply | Qty: 90 | Fill #0

## 2017-08-03 MED FILL — LOSARTAN-HCTZ 100-25 MG TAB: 100-25 | 90 days supply | Qty: 90 | Fill #1

## 2017-08-03 MED FILL — ROSUVASTATIN CALCIUM 40 MG: 40 | 90 days supply | Qty: 90 | Fill #1

## 2017-08-03 MED FILL — JARDIANCE 10 MG TABLET: 10 | 30 days supply | Qty: 30 | Fill #1

## 2017-08-03 MED FILL — ESOMEPRAZOLE MAG DR 40 MG C: 40 | 90 days supply | Qty: 90 | Fill #0

## 2017-09-21 IMAGING — DX DG CHEST 2V
2 series · 2 of 2 positions shown · non-contrast
Comparison: 09/10/2012

CLINICAL DATA: Chest pain radiating into the neck.

EXAM:
CHEST  2 VIEW

[chest pa]
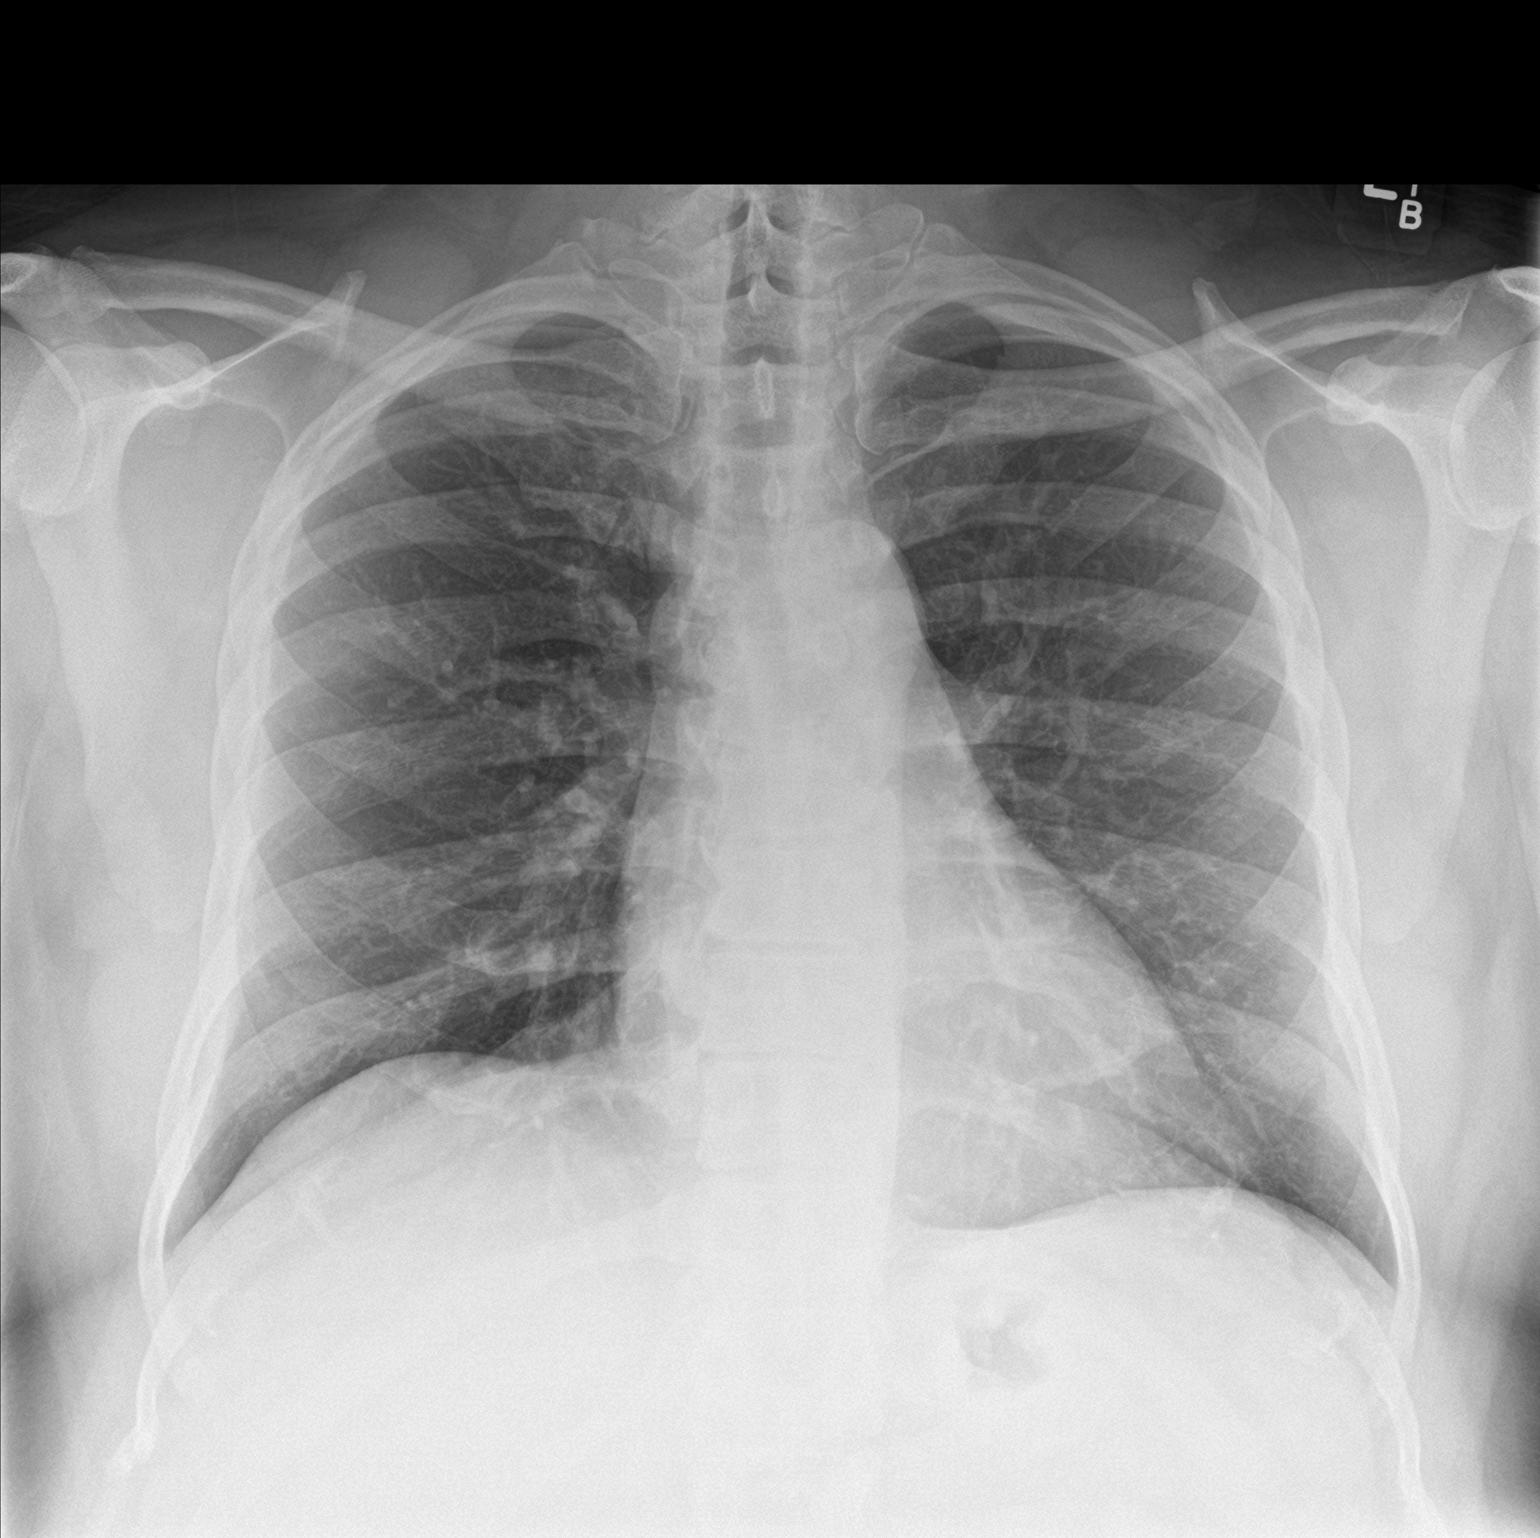

[chest lat]
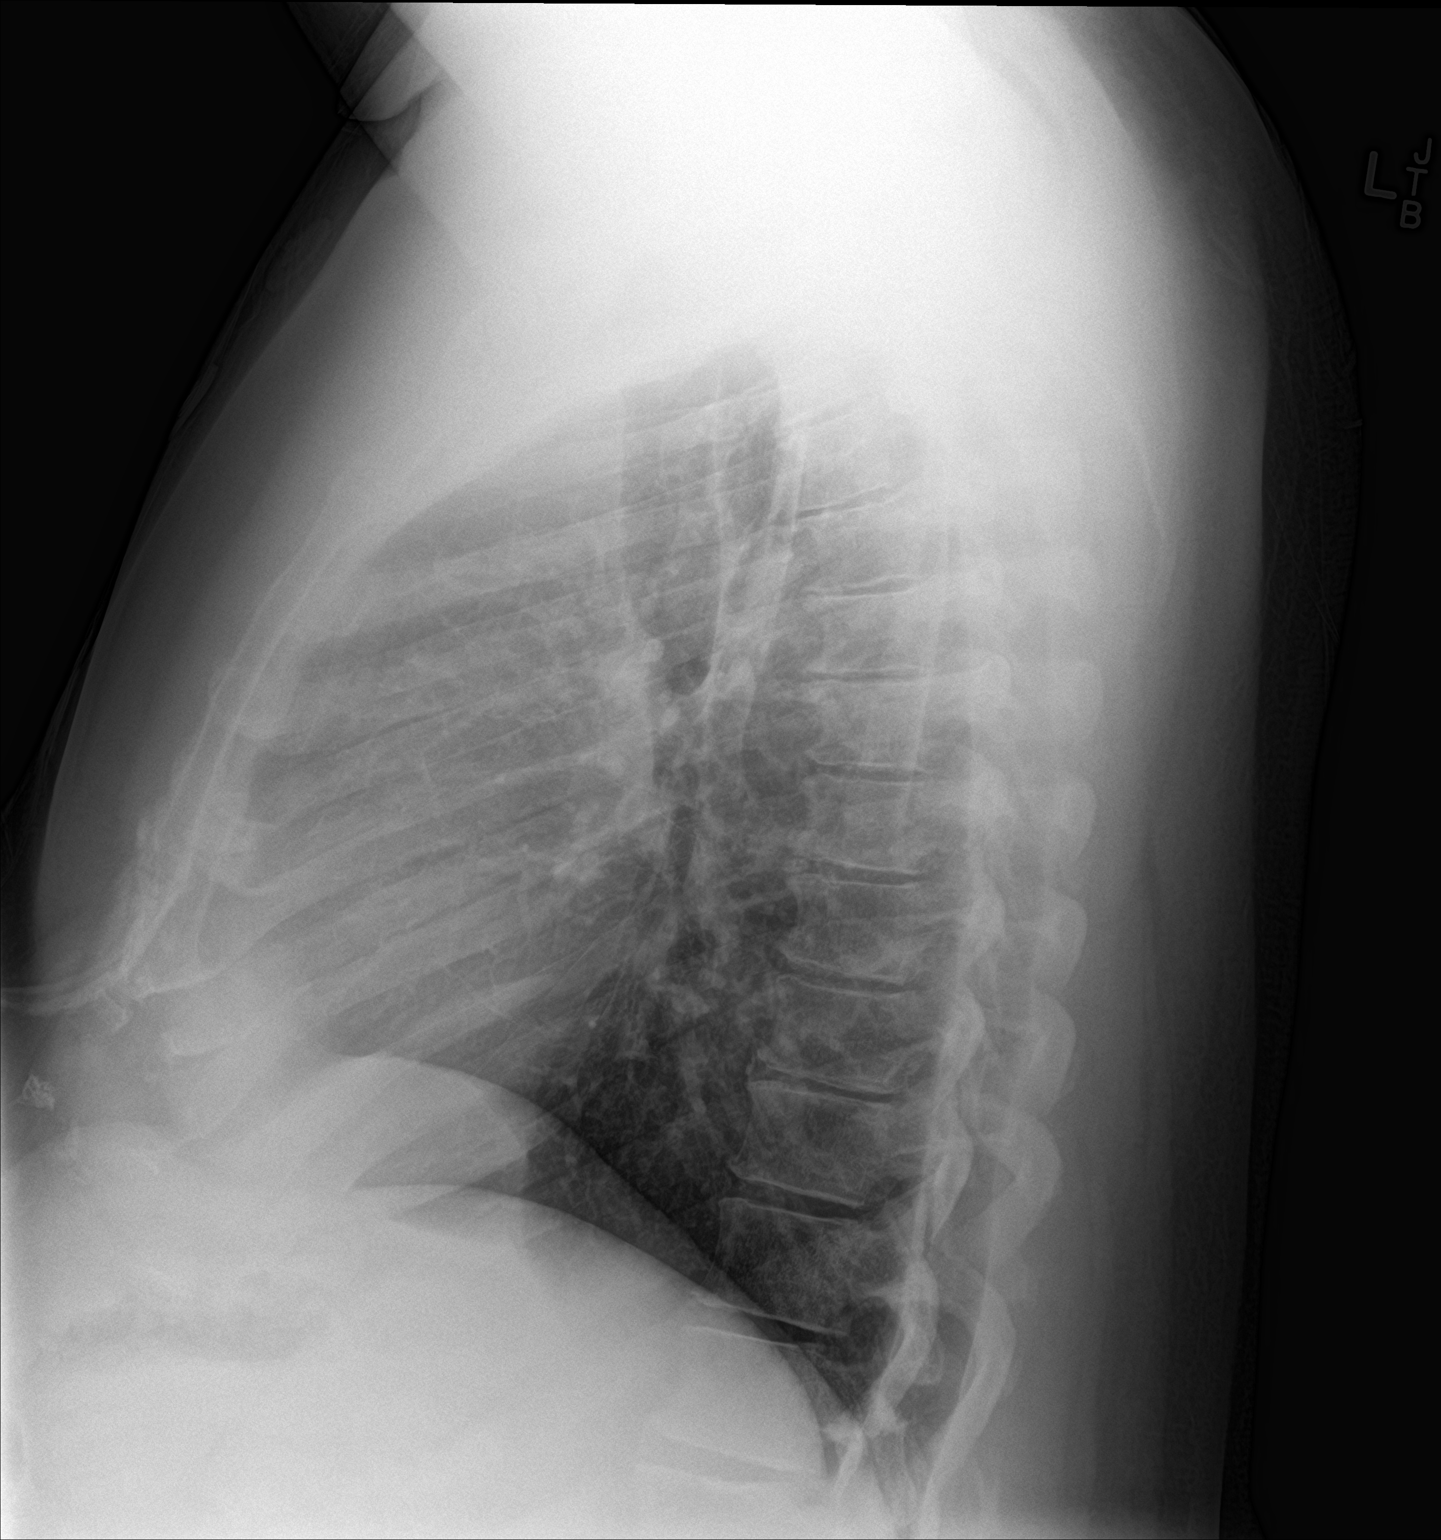

[2 of 2 positions shown; findings below may reference images not displayed]

FINDINGS: Mild thoracic spondylosis.

The lungs appear clear. Cardiac and mediastinal contours normal. No
pleural effusion identified.
IMPRESSION: 1.  No active cardiopulmonary disease is radiographically apparent.
2. Mild thoracic spondylosis.

## 2017-09-26 MED FILL — JARDIANCE 10 MG TABLET: 10 | 30 days supply | Qty: 30 | Fill #2

## 2017-09-26 MED FILL — metFORMIN HCL 1000 MG TABS: 1000 | 30 days supply | Qty: 60 | Fill #1

## 2018-01-22 MED FILL — MONTELUKAST SOD 10 MG TAB: 10 | 30 days supply | Qty: 30 | Fill #0

## 2018-01-22 MED FILL — FLOVENT HFA 44 MCG INHALER: 44 | 60 days supply | Qty: 11 | Fill #0

## 2018-01-28 MED FILL — metFORMIN HCL 1000 MG TABS: 1000 | 30 days supply | Qty: 60 | Fill #2

## 2018-01-28 MED FILL — ROSUVASTATIN CALCIUM 40 MG: 40 | 90 days supply | Qty: 90 | Fill #0

## 2018-01-28 MED FILL — ESOMEPRAZOLE MAG DR 40 MG C: 40 | 90 days supply | Qty: 90 | Fill #1

## 2018-01-28 MED FILL — JARDIANCE 10 MG TABLET: 10 | 30 days supply | Qty: 30 | Fill #3

## 2018-01-28 MED FILL — LOSARTAN-HCTZ 100-25 MG TAB: 100-25 | 90 days supply | Qty: 90 | Fill #0

## 2018-01-28 MED FILL — EZETIMIBE 10 MG TABLET: 10 | 90 days supply | Qty: 90 | Fill #0

## 2018-04-09 MED FILL — metFORMIN HCL 1000 MG TABS: 1000 | 30 days supply | Qty: 60 | Fill #3

## 2018-04-09 MED FILL — JARDIANCE 10 MG TABLET: 10 | 30 days supply | Qty: 30 | Fill #4

## 2018-04-12 IMAGING — DX DG SHOULDER 2+V*R*
3 series · 3 of 3 positions shown · non-contrast
Comparison: None.

CLINICAL DATA: Sharp right shoulder pain

EXAM:
RIGHT SHOULDER - 2+ VIEW

[w shoulder external right]
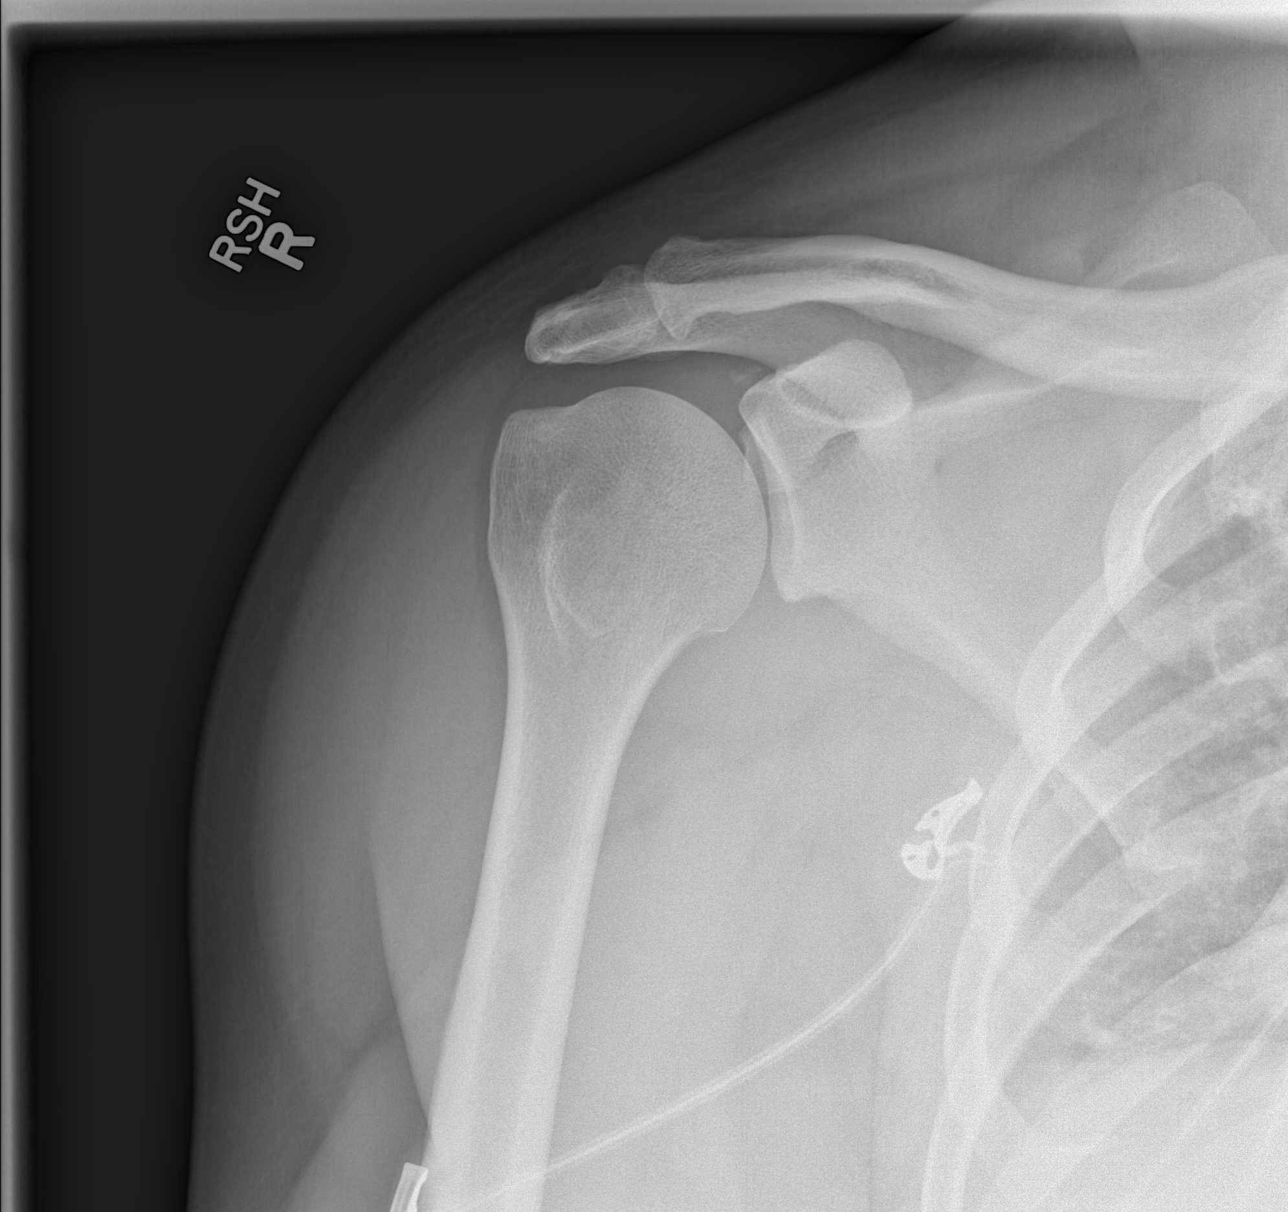

[w shoulder y-view right]
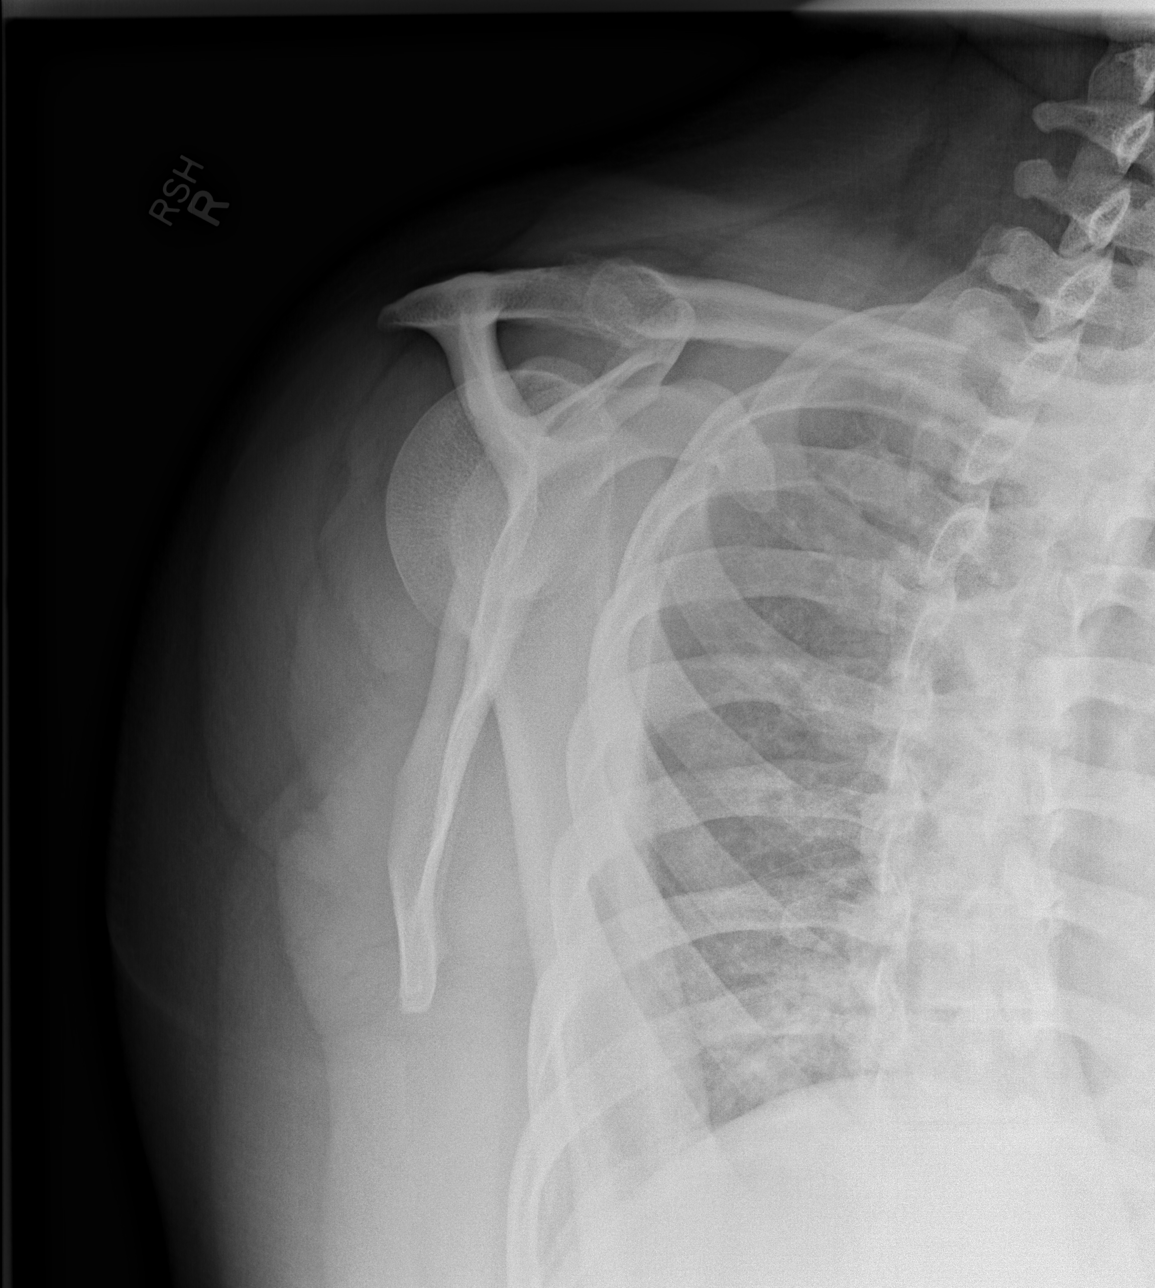

[x shoulder ap right]
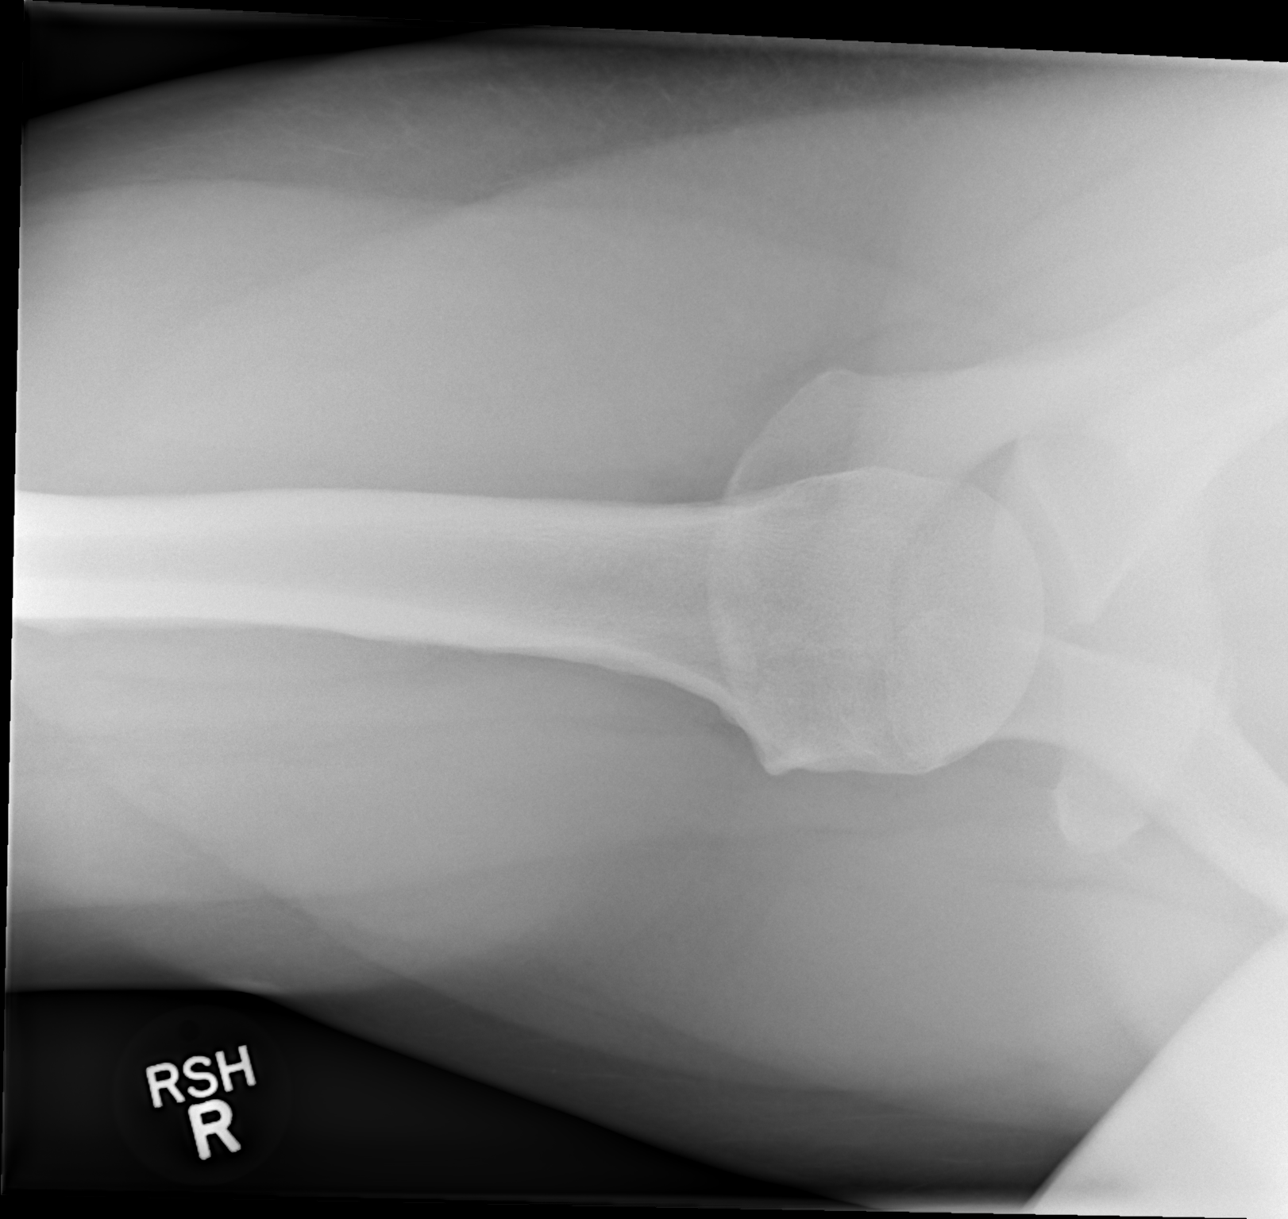

[3 of 3 positions shown; findings below may reference images not displayed]

FINDINGS: Mild degenerative changes in the AC joint with joint space narrowing
and spurring. Glenohumeral joint is maintained. No acute bony
abnormality. Specifically, no fracture, subluxation, or dislocation.
Soft tissues are intact.
IMPRESSION: Mild degenerative changes in the AC joint.  No acute findings.

## 2018-05-21 MED FILL — metFORMIN HCL 1000 MG TABS: 1000 | 30 days supply | Qty: 60 | Fill #4

## 2018-05-21 MED FILL — JARDIANCE 10 MG TABLET: 10 | 30 days supply | Qty: 30 | Fill #5

## 2018-07-08 DIAGNOSIS — M5416 Radiculopathy, lumbar region: Secondary | ICD-10-CM | POA: Diagnosis not present

## 2018-07-08 DIAGNOSIS — M9904 Segmental and somatic dysfunction of sacral region: Secondary | ICD-10-CM | POA: Diagnosis not present

## 2018-07-08 DIAGNOSIS — M9906 Segmental and somatic dysfunction of lower extremity: Secondary | ICD-10-CM | POA: Diagnosis not present

## 2018-07-08 DIAGNOSIS — M5136 Other intervertebral disc degeneration, lumbar region: Secondary | ICD-10-CM | POA: Diagnosis not present

## 2018-07-08 DIAGNOSIS — M9902 Segmental and somatic dysfunction of thoracic region: Secondary | ICD-10-CM | POA: Diagnosis not present

## 2018-07-08 DIAGNOSIS — M25552 Pain in left hip: Secondary | ICD-10-CM | POA: Diagnosis not present

## 2018-07-08 DIAGNOSIS — M9903 Segmental and somatic dysfunction of lumbar region: Secondary | ICD-10-CM | POA: Diagnosis not present

## 2018-07-08 DIAGNOSIS — M62830 Muscle spasm of back: Secondary | ICD-10-CM | POA: Diagnosis not present

## 2018-07-12 MED FILL — ROSUVASTATIN CALCIUM 40 MG: 40 | 90 days supply | Qty: 90 | Fill #0

## 2018-07-12 MED FILL — EZETIMIBE 10 MG TABS: 10 | 90 days supply | Qty: 90 | Fill #0

## 2018-07-12 MED FILL — JARDIANCE 10 MG TABLET: 10 | 90 days supply | Qty: 90 | Fill #0

## 2018-07-12 MED FILL — LOSARTAN-HCTZ 100-25 MG TAB: 100-25 | 90 days supply | Qty: 90 | Fill #0

## 2018-07-12 MED FILL — metFORMIN HCL 1000 MG TABS: 1000 | 90 days supply | Qty: 180 | Fill #0

## 2018-07-12 MED FILL — ESOMEPRAZOLE MAG DR 40 MG C: 40 | 90 days supply | Qty: 90 | Fill #0

## 2018-08-19 DIAGNOSIS — M9904 Segmental and somatic dysfunction of sacral region: Secondary | ICD-10-CM | POA: Diagnosis not present

## 2018-08-19 DIAGNOSIS — M62830 Muscle spasm of back: Secondary | ICD-10-CM | POA: Diagnosis not present

## 2018-08-19 DIAGNOSIS — M9906 Segmental and somatic dysfunction of lower extremity: Secondary | ICD-10-CM | POA: Diagnosis not present

## 2018-08-19 DIAGNOSIS — M9903 Segmental and somatic dysfunction of lumbar region: Secondary | ICD-10-CM | POA: Diagnosis not present

## 2018-08-19 DIAGNOSIS — M5136 Other intervertebral disc degeneration, lumbar region: Secondary | ICD-10-CM | POA: Diagnosis not present

## 2018-08-19 DIAGNOSIS — M9902 Segmental and somatic dysfunction of thoracic region: Secondary | ICD-10-CM | POA: Diagnosis not present

## 2018-08-19 DIAGNOSIS — M5416 Radiculopathy, lumbar region: Secondary | ICD-10-CM | POA: Diagnosis not present

## 2018-08-19 DIAGNOSIS — M25552 Pain in left hip: Secondary | ICD-10-CM | POA: Diagnosis not present

## 2018-09-30 DIAGNOSIS — M9902 Segmental and somatic dysfunction of thoracic region: Secondary | ICD-10-CM | POA: Diagnosis not present

## 2018-09-30 DIAGNOSIS — M9906 Segmental and somatic dysfunction of lower extremity: Secondary | ICD-10-CM | POA: Diagnosis not present

## 2018-09-30 DIAGNOSIS — M5416 Radiculopathy, lumbar region: Secondary | ICD-10-CM | POA: Diagnosis not present

## 2018-09-30 DIAGNOSIS — M62830 Muscle spasm of back: Secondary | ICD-10-CM | POA: Diagnosis not present

## 2018-09-30 DIAGNOSIS — M5136 Other intervertebral disc degeneration, lumbar region: Secondary | ICD-10-CM | POA: Diagnosis not present

## 2018-09-30 DIAGNOSIS — M25552 Pain in left hip: Secondary | ICD-10-CM | POA: Diagnosis not present

## 2018-09-30 DIAGNOSIS — M9904 Segmental and somatic dysfunction of sacral region: Secondary | ICD-10-CM | POA: Diagnosis not present

## 2018-09-30 DIAGNOSIS — M9903 Segmental and somatic dysfunction of lumbar region: Secondary | ICD-10-CM | POA: Diagnosis not present

## 2018-10-04 DIAGNOSIS — Z6841 Body Mass Index (BMI) 40.0 and over, adult: Secondary | ICD-10-CM | POA: Diagnosis not present

## 2018-10-04 DIAGNOSIS — G4730 Sleep apnea, unspecified: Secondary | ICD-10-CM | POA: Diagnosis not present

## 2018-10-04 DIAGNOSIS — E7800 Pure hypercholesterolemia, unspecified: Secondary | ICD-10-CM | POA: Diagnosis not present

## 2018-10-04 DIAGNOSIS — M109 Gout, unspecified: Secondary | ICD-10-CM | POA: Diagnosis not present

## 2018-10-04 DIAGNOSIS — I10 Essential (primary) hypertension: Secondary | ICD-10-CM | POA: Diagnosis not present

## 2018-10-04 DIAGNOSIS — K219 Gastro-esophageal reflux disease without esophagitis: Secondary | ICD-10-CM | POA: Diagnosis not present

## 2018-10-04 DIAGNOSIS — Z7984 Long term (current) use of oral hypoglycemic drugs: Secondary | ICD-10-CM | POA: Diagnosis not present

## 2018-10-04 DIAGNOSIS — E1169 Type 2 diabetes mellitus with other specified complication: Secondary | ICD-10-CM | POA: Diagnosis not present

## 2018-10-07 DIAGNOSIS — E7800 Pure hypercholesterolemia, unspecified: Secondary | ICD-10-CM | POA: Diagnosis not present

## 2018-10-07 DIAGNOSIS — M109 Gout, unspecified: Secondary | ICD-10-CM | POA: Diagnosis not present

## 2018-10-07 DIAGNOSIS — E1169 Type 2 diabetes mellitus with other specified complication: Secondary | ICD-10-CM | POA: Diagnosis not present

## 2018-10-07 DIAGNOSIS — I10 Essential (primary) hypertension: Secondary | ICD-10-CM | POA: Diagnosis not present

## 2018-10-07 DIAGNOSIS — Z7984 Long term (current) use of oral hypoglycemic drugs: Secondary | ICD-10-CM | POA: Diagnosis not present

## 2018-10-10 MED FILL — JARDIANCE 25 MG TABLET: 25 | 30 days supply | Qty: 30 | Fill #0

## 2018-10-31 DIAGNOSIS — H40013 Open angle with borderline findings, low risk, bilateral: Secondary | ICD-10-CM | POA: Diagnosis not present

## 2018-11-12 DIAGNOSIS — M25552 Pain in left hip: Secondary | ICD-10-CM | POA: Diagnosis not present

## 2018-11-12 DIAGNOSIS — M9903 Segmental and somatic dysfunction of lumbar region: Secondary | ICD-10-CM | POA: Diagnosis not present

## 2018-11-12 DIAGNOSIS — M5416 Radiculopathy, lumbar region: Secondary | ICD-10-CM | POA: Diagnosis not present

## 2018-11-12 DIAGNOSIS — M5136 Other intervertebral disc degeneration, lumbar region: Secondary | ICD-10-CM | POA: Diagnosis not present

## 2018-11-12 DIAGNOSIS — M9904 Segmental and somatic dysfunction of sacral region: Secondary | ICD-10-CM | POA: Diagnosis not present

## 2018-11-12 DIAGNOSIS — M9902 Segmental and somatic dysfunction of thoracic region: Secondary | ICD-10-CM | POA: Diagnosis not present

## 2018-11-12 DIAGNOSIS — M9906 Segmental and somatic dysfunction of lower extremity: Secondary | ICD-10-CM | POA: Diagnosis not present

## 2018-11-12 DIAGNOSIS — M62830 Muscle spasm of back: Secondary | ICD-10-CM | POA: Diagnosis not present

## 2018-11-20 DIAGNOSIS — M25552 Pain in left hip: Secondary | ICD-10-CM | POA: Diagnosis not present

## 2018-11-20 DIAGNOSIS — I10 Essential (primary) hypertension: Secondary | ICD-10-CM | POA: Diagnosis not present

## 2018-11-20 MED FILL — DICLOFENAC SOD EC 50 MG TAB: 50 | 15 days supply | Qty: 30 | Fill #0

## 2018-11-22 ENCOUNTER — Ambulatory Visit (HOSPITAL_COMMUNITY)
Admission: RE | Admit: 2018-11-22 | Discharge: 2018-11-22 | Disposition: A | Discharge status: Home / Self Care | Payer: 59 | Source: Ambulatory Visit | Attending: Physician Assistant | Admitting: Physician Assistant

## 2018-11-22 ENCOUNTER — Other Ambulatory Visit (HOSPITAL_COMMUNITY): Payer: Self-pay | Admitting: Physician Assistant

## 2018-11-22 DIAGNOSIS — M25559 Pain in unspecified hip: Secondary | ICD-10-CM

## 2018-11-22 DIAGNOSIS — M25551 Pain in right hip: Secondary | ICD-10-CM | POA: Diagnosis not present

## 2018-11-22 DIAGNOSIS — M25552 Pain in left hip: Secondary | ICD-10-CM | POA: Diagnosis not present

## 2018-11-26 MED FILL — JARDIANCE 25 MG TABLET: 25 | 90 days supply | Qty: 90 | Fill #1

## 2018-12-16 MED FILL — LOSARTAN-HCTZ 100-25 MG TAB: 100-25 | 90 days supply | Qty: 90 | Fill #0

## 2018-12-16 MED FILL — EZETIMIBE 10 MG TABS: 10 | 90 days supply | Qty: 90 | Fill #1

## 2018-12-16 MED FILL — metFORMIN HCL 1000 MG TABS: 1000 | 90 days supply | Qty: 180 | Fill #0

## 2018-12-16 MED FILL — ESOMEPRAZOLE MAG DR 40 MG C: 40 | 90 days supply | Qty: 90 | Fill #0

## 2018-12-16 MED FILL — ROSUVASTATIN CALCIUM 40 MG: 40 | 90 days supply | Qty: 90 | Fill #0

## 2019-03-25 ENCOUNTER — Other Ambulatory Visit: Payer: Self-pay

## 2019-03-25 DIAGNOSIS — Z20822 Contact with and (suspected) exposure to covid-19: Secondary | ICD-10-CM

## 2019-03-27 LAB — NOVEL CORONAVIRUS, NAA: SARS-CoV-2, NAA: NOT DETECTED

## 2019-04-07 DIAGNOSIS — E1169 Type 2 diabetes mellitus with other specified complication: Secondary | ICD-10-CM | POA: Diagnosis not present

## 2019-04-07 DIAGNOSIS — K219 Gastro-esophageal reflux disease without esophagitis: Secondary | ICD-10-CM | POA: Diagnosis not present

## 2019-04-07 DIAGNOSIS — G4730 Sleep apnea, unspecified: Secondary | ICD-10-CM | POA: Diagnosis not present

## 2019-04-07 DIAGNOSIS — E7800 Pure hypercholesterolemia, unspecified: Secondary | ICD-10-CM | POA: Diagnosis not present

## 2019-04-07 DIAGNOSIS — Z0000 Encounter for general adult medical examination without abnormal findings: Secondary | ICD-10-CM | POA: Diagnosis not present

## 2019-04-07 DIAGNOSIS — M109 Gout, unspecified: Secondary | ICD-10-CM | POA: Diagnosis not present

## 2019-04-07 DIAGNOSIS — I10 Essential (primary) hypertension: Secondary | ICD-10-CM | POA: Diagnosis not present

## 2019-04-07 DIAGNOSIS — Z125 Encounter for screening for malignant neoplasm of prostate: Secondary | ICD-10-CM | POA: Diagnosis not present

## 2019-04-09 DIAGNOSIS — I10 Essential (primary) hypertension: Secondary | ICD-10-CM | POA: Diagnosis not present

## 2019-04-09 DIAGNOSIS — E7800 Pure hypercholesterolemia, unspecified: Secondary | ICD-10-CM | POA: Diagnosis not present

## 2019-04-09 DIAGNOSIS — E1169 Type 2 diabetes mellitus with other specified complication: Secondary | ICD-10-CM | POA: Diagnosis not present

## 2019-04-09 DIAGNOSIS — Z125 Encounter for screening for malignant neoplasm of prostate: Secondary | ICD-10-CM | POA: Diagnosis not present

## 2019-04-14 MED FILL — RYBELSUS 3 MG TABS: 3 | 30 days supply | Qty: 30 | Fill #0

## 2019-05-12 DIAGNOSIS — H5213 Myopia, bilateral: Secondary | ICD-10-CM | POA: Diagnosis not present

## 2019-05-28 MED FILL — metFORMIN HCL 1000 MG TABS: 1000 | 90 days supply | Qty: 180 | Fill #0

## 2019-05-28 MED FILL — ESOMEPRAZOLE MAG DR 40 MG C: 40 | 90 days supply | Qty: 90 | Fill #0

## 2019-05-28 MED FILL — EZETIMIBE 10 MG TABS: 10 | 90 days supply | Qty: 90 | Fill #0

## 2019-05-28 MED FILL — JARDIANCE 25 MG TABLET: 25 | 90 days supply | Qty: 90 | Fill #0

## 2019-05-28 MED FILL — ROSUVASTATIN CALCIUM 40 MG: 40 | 90 days supply | Qty: 90 | Fill #0

## 2019-05-28 MED FILL — LOSARTAN-HCTZ 100-25 MG TAB: 100-25 | 90 days supply | Qty: 90 | Fill #0

## 2019-06-20 ENCOUNTER — Ambulatory Visit
Admission: EM | Admit: 2019-06-20 | Discharge: 2019-06-20 | Disposition: A | Discharge status: Home / Self Care | Payer: 59 | Attending: Family Medicine | Admitting: Family Medicine

## 2019-06-20 ENCOUNTER — Other Ambulatory Visit: Payer: Self-pay

## 2019-06-20 ENCOUNTER — Ambulatory Visit (INDEPENDENT_AMBULATORY_CARE_PROVIDER_SITE_OTHER): Payer: 59

## 2019-06-20 ENCOUNTER — Encounter: Payer: Self-pay | Admitting: Emergency Medicine

## 2019-06-20 DIAGNOSIS — M25531 Pain in right wrist: Secondary | ICD-10-CM

## 2019-06-20 DIAGNOSIS — M654 Radial styloid tenosynovitis [de Quervain]: Secondary | ICD-10-CM

## 2019-06-20 MED ORDER — PREDNISONE 10 MG PO TABS: ORAL_TABLET | ORAL | 0 refills | Status: DC

## 2019-06-20 NOTE — ED Provider Notes (Signed)
[facility]    CSN: [id number] Arrival date & time: 06/20/19  0815      History   Chief Complaint Chief Complaint  Patient presents with  . Wrist Pain    right    HPI Kerry Nelson is a 54 y.o. male.   54 yo male with a c/o right wrist pain for the past 3 weeks. Denies any falls, traumatic injuries, rash, redness. He has noticed some slight swelling on the thumb side of the wrist.    Wrist Pain    Past Medical History:  Diagnosis Date  . Chest pain   . GERD (gastroesophageal reflux disease)   . Hyperlipidemia   . Hypertension   . Prediabetes   . Sleep apnea 2010   does not use Cpap    Patient Active Problem List   Diagnosis Date Noted  . Left hip pain 06/19/2016  . ELBOW PAIN, LEFT 08/05/2009  . SHOULDER IMPINGEMENT SYNDROME 08/05/2009  . LATERAL EPICONDYLITIS 08/05/2009  . ANKLE PAIN, LEFT 02/10/2009  . OTHER ACQUIRED DEFORMITY OF ANKLE AND FOOT OTHER 02/10/2009  . OSTEOARTHRITIS, KNEE, RIGHT 03/10/2008  . KNEE PAIN 03/10/2008  . UNEQUAL LEG LENGTH 03/10/2008    Past Surgical History:  Procedure Laterality Date  . CARDIAC CATHETERIZATION  2010   Dr Allyson Sabal   . CORRECTION HAMMER TOE Left   . FOOT SURGERY     bone spur removal  . HERNIA REPAIR    . LIPOMA EXCISION     x2  . SHOULDER ARTHROSCOPY WITH DISTAL CLAVICLE RESECTION Left 09/17/2012   Procedure: SHOULDER ARTHROSCOPY WITH DISTAL CLAVICLE RESECTION;  Surgeon: Valeria Batman, MD;  Location: [facility] OR;  Service: Orthopedics;  Laterality: Left;  Left shoulder arthroscopy, subacromial decompression, distal clavicle resection.  Marland Kitchen UMBILICAL HERNIA REPAIR         Home Medications    Prior to Admission medications   Medication Sig Start Date End Date Taking? Authorizing Provider  esomeprazole (NEXIUM) 40 MG capsule Take 40 mg by mouth daily before breakfast.   Yes [provider]  ezetimibe (ZETIA) 10 MG tablet Take 10 mg by mouth daily.   Yes [provider]    gabapentin (NEURONTIN) 100 MG capsule Take 1 capsule (100 mg total) by mouth at bedtime. 08/02/16  Yes Myra Rude, MD  losartan-hydrochlorothiazide (HYZAAR) 100-25 MG per tablet Take 1 tablet by mouth daily.   Yes [provider]  metFORMIN (GLUCOPHAGE) 500 MG tablet Take 500 mg by mouth daily with breakfast.    Yes [provider]  rosuvastatin (CRESTOR) 40 MG tablet Take 40 mg by mouth daily.   Yes [provider]  acetaminophen (TYLENOL) 500 MG tablet Take 1,000 mg by mouth every 6 (six) hours as needed for moderate pain.    [provider]  predniSONE (DELTASONE) 10 MG tablet Start 60 mg po day one, then 50 mg po day two, taper by 10 mg daily until complete. 06/20/19   Payton Mccallum, MD    Family History Family History  Problem Relation Age of Onset  . Asthma Mother   . Depression Mother   . Hypertension Brother   . Seizures Brother     Social History Social History   Tobacco Use  . Smoking status: Former Smoker    Packs/day: 0.50    Years: 25.00    Pack years: 12.50    Types: Cigarettes    Quit date: 09/21/2013    Years since quitting: 5.7  .  Smokeless tobacco: Never Used  Substance Use Topics  . Alcohol use: Yes    Alcohol/week: 6.0 standard drinks    Types: 6 Cans of beer per week  . Drug use: No     Allergies   Patient has no known allergies.   Review of Systems Review of Systems   Physical Exam Triage Vital Signs ED Triage Vitals  Enc Vitals Group     BP 06/20/19 0825 (!) 180/118     Pulse Rate 06/20/19 0825 95     Resp 06/20/19 0825 16     Temp 06/20/19 0825 98.4 F (36.9 C)     Temp Source 06/20/19 0825 Oral     SpO2 06/20/19 0825 100 %     Weight 06/20/19 0824 (!) 315 lb (142.9 kg)     Height 06/20/19 0824 6' (1.829 m)     Head Circumference --      Peak Flow --      Pain Score 06/20/19 0824 7     Pain Loc --      Pain Edu? --      Excl. in [place]? --    No data found.  Updated Vital Signs BP (!)  180/118 (BP Location: Left Arm) Comment: Patient states that he has not taken his BP medicine this morning  Pulse 95   Temp 98.4 F (36.9 C) (Oral)   Resp 16   Ht 6' (1.829 m)   Wt (!) 142.9 kg   SpO2 100%   BMI 42.72 kg/m   Visual Acuity Right Eye Distance:   Left Eye Distance:   Bilateral Distance:    Right Eye Near:   Left Eye Near:    Bilateral Near:     Physical Exam Vitals and nursing note reviewed.  Constitutional:      General: He is not in acute distress.    Appearance: He is not toxic-appearing or diaphoretic.  Musculoskeletal:     Right wrist: Swelling (mild), tenderness (over the thumb extensor tendons), bony tenderness and crepitus present. No deformity, effusion, lacerations or snuff box tenderness. Normal range of motion. Normal pulse.     Comments: Positive Finkelstein test; hand neurovascularly intact  Neurological:     Mental Status: He is alert.      UC Treatments / Results  Labs (all labs ordered are listed, but only abnormal results are displayed) Labs Reviewed - No data to display  EKG   Radiology DG Wrist Complete Right  Result Date: 06/20/2019 CLINICAL DATA:  Wrist pain starting 3-4 weeks ago. No injury or fall. EXAM: RIGHT WRIST - COMPLETE 3+ VIEW COMPARISON:  None. FINDINGS: Osseous alignment is normal. Bone mineralization is normal. No fracture line or displaced fracture fragment is seen. No acute or suspicious osseous finding. No degenerative change. Soft tissues about the RIGHT wrist are unremarkable. IMPRESSION: Negative. Electronically Signed   By: Franki Cabot M.D.   On: 06/20/2019 08:45    Procedures Procedures (including critical care time)  Medications Ordered in UC Medications - No data to display  Initial Impression / Assessment and Plan / UC Course  I have reviewed the triage vital signs and the nursing notes.  Pertinent labs & imaging results that were available during my care of the patient were reviewed by me and  considered in my medical decision making (see chart for details).      Final Clinical Impressions(s) / UC Diagnoses   Final diagnoses:  De Quervain's tenosynovitis, right     Discharge  Instructions     Rest, ice, over the counter tylenol If no improvement then follow up with hand orthopedist    ED Prescriptions    Medication Sig Dispense Auth. Provider   predniSONE (DELTASONE) 10 MG tablet  (Status: Discontinued) Start 60 mg po day one, then 50 mg po day two, taper by 10 mg daily until complete. 21 tablet Payton Mccallum, MD   predniSONE (DELTASONE) 10 MG tablet Start 60 mg po day one, then 50 mg po day two, taper by 10 mg daily until complete. 21 tablet Payton Mccallum, MD      1. x-ray results and diagnosis reviewed with patient 2. rx as per orders above; reviewed possible side effects, interactions, risks and benefits  3. Thumb spica splint 4.  Recommend supportive treatment as above 5. Follow-up prn if symptoms worsen or don't improve  PDMP not reviewed this encounter.   Payton Mccallum, MD 06/20/19 [phone]

## 2019-06-20 NOTE — Discharge Instructions (Signed)
Rest, ice, over the counter tylenol If no improvement then follow up with hand orthopedist

## 2019-06-20 NOTE — ED Triage Notes (Signed)
Patient c/o right wrist pain that started 3 weeks ago.  Patient states that he has not taken any OTC medicine for pain.  Patient denies injury or fall.

## 2019-07-15 DIAGNOSIS — M79644 Pain in right finger(s): Secondary | ICD-10-CM | POA: Diagnosis not present

## 2019-07-15 DIAGNOSIS — E7800 Pure hypercholesterolemia, unspecified: Secondary | ICD-10-CM | POA: Diagnosis not present

## 2019-07-15 DIAGNOSIS — E1169 Type 2 diabetes mellitus with other specified complication: Secondary | ICD-10-CM | POA: Diagnosis not present

## 2019-07-15 MED FILL — RYBELSUS 3 MG TABS: 3 | 30 days supply | Qty: 30 | Fill #0

## 2019-08-06 DIAGNOSIS — M654 Radial styloid tenosynovitis [de Quervain]: Secondary | ICD-10-CM | POA: Diagnosis not present

## 2019-08-08 MED FILL — RYBELSUS 3 MG TABS: 3 | 30 days supply | Qty: 30 | Fill #1

## 2019-09-04 DIAGNOSIS — M654 Radial styloid tenosynovitis [de Quervain]: Secondary | ICD-10-CM | POA: Diagnosis not present

## 2019-09-15 MED FILL — RYBELSUS 3 MG TABS: 3 | 30 days supply | Qty: 30 | Fill #0

## 2019-09-22 MED FILL — LOSARTAN-HCTZ 100-25 MG TAB: 100-25 | 90 days supply | Qty: 90 | Fill #1

## 2019-09-22 MED FILL — EZETIMIBE 10 MG TABS: 10 | 90 days supply | Qty: 90 | Fill #1

## 2019-09-22 MED FILL — ROSUVASTATIN CALCIUM 40 MG: 40 | 90 days supply | Qty: 90 | Fill #1

## 2019-09-22 MED FILL — JARDIANCE 25 MG TABLET: 25 | 90 days supply | Qty: 90 | Fill #1

## 2019-09-22 MED FILL — ESOMEPRAZOLE MAG DR 40 MG C: 40 | 90 days supply | Qty: 90 | Fill #1

## 2019-10-09 DIAGNOSIS — K219 Gastro-esophageal reflux disease without esophagitis: Secondary | ICD-10-CM | POA: Diagnosis not present

## 2019-10-09 DIAGNOSIS — I10 Essential (primary) hypertension: Secondary | ICD-10-CM | POA: Diagnosis not present

## 2019-10-09 DIAGNOSIS — E7800 Pure hypercholesterolemia, unspecified: Secondary | ICD-10-CM | POA: Diagnosis not present

## 2019-10-09 DIAGNOSIS — E1169 Type 2 diabetes mellitus with other specified complication: Secondary | ICD-10-CM | POA: Diagnosis not present

## 2019-10-09 DIAGNOSIS — M109 Gout, unspecified: Secondary | ICD-10-CM | POA: Diagnosis not present

## 2019-12-30 MED FILL — metFORMIN HCL 1000 MG TABS: 1000 | 90 days supply | Qty: 180 | Fill #1

## 2020-01-12 DIAGNOSIS — E1169 Type 2 diabetes mellitus with other specified complication: Secondary | ICD-10-CM | POA: Diagnosis not present

## 2020-01-20 MED FILL — RYBELSUS 14 MG TABS: 14 | 90 days supply | Qty: 90 | Fill #0

## 2020-01-26 MED FILL — ROSUVASTATIN CALCIUM 40 MG: 40 | 90 days supply | Qty: 90 | Fill #0

## 2020-01-26 MED FILL — LOSARTAN-HCTZ 100-25 MG TAB: 100-25 | 90 days supply | Qty: 90 | Fill #0

## 2020-01-26 MED FILL — ESOMEPRAZOLE MAG DR 40 MG C: 40 | 90 days supply | Qty: 90 | Fill #0

## 2020-01-26 MED FILL — JARDIANCE 25 MG TABLET: 25 | 90 days supply | Qty: 90 | Fill #0

## 2020-01-26 MED FILL — EZETIMIBE 10 MG TABS: 10 | 90 days supply | Qty: 90 | Fill #0

## 2020-03-02 DIAGNOSIS — M654 Radial styloid tenosynovitis [de Quervain]: Secondary | ICD-10-CM | POA: Diagnosis not present

## 2020-03-08 DIAGNOSIS — M25552 Pain in left hip: Secondary | ICD-10-CM | POA: Diagnosis not present

## 2020-03-08 DIAGNOSIS — M9904 Segmental and somatic dysfunction of sacral region: Secondary | ICD-10-CM | POA: Diagnosis not present

## 2020-03-08 DIAGNOSIS — M5136 Other intervertebral disc degeneration, lumbar region: Secondary | ICD-10-CM | POA: Diagnosis not present

## 2020-03-08 DIAGNOSIS — M9903 Segmental and somatic dysfunction of lumbar region: Secondary | ICD-10-CM | POA: Diagnosis not present

## 2020-03-08 DIAGNOSIS — M9906 Segmental and somatic dysfunction of lower extremity: Secondary | ICD-10-CM | POA: Diagnosis not present

## 2020-03-08 DIAGNOSIS — M5416 Radiculopathy, lumbar region: Secondary | ICD-10-CM | POA: Diagnosis not present

## 2020-03-08 DIAGNOSIS — M9902 Segmental and somatic dysfunction of thoracic region: Secondary | ICD-10-CM | POA: Diagnosis not present

## 2020-03-08 DIAGNOSIS — M62830 Muscle spasm of back: Secondary | ICD-10-CM | POA: Diagnosis not present

## 2020-04-02 ENCOUNTER — Other Ambulatory Visit (HOSPITAL_COMMUNITY): Payer: Self-pay | Admitting: Orthopedic Surgery

## 2020-04-02 DIAGNOSIS — M654 Radial styloid tenosynovitis [de Quervain]: Secondary | ICD-10-CM | POA: Diagnosis not present

## 2020-04-02 MED FILL — oxyCODONE HCL 5 MG TABS: 5 | 3 days supply | Qty: 10 | Fill #0

## 2020-04-07 DIAGNOSIS — Z125 Encounter for screening for malignant neoplasm of prostate: Secondary | ICD-10-CM | POA: Diagnosis not present

## 2020-04-07 DIAGNOSIS — I10 Essential (primary) hypertension: Secondary | ICD-10-CM | POA: Diagnosis not present

## 2020-04-07 DIAGNOSIS — G4730 Sleep apnea, unspecified: Secondary | ICD-10-CM | POA: Diagnosis not present

## 2020-04-07 DIAGNOSIS — Z0000 Encounter for general adult medical examination without abnormal findings: Secondary | ICD-10-CM | POA: Diagnosis not present

## 2020-04-07 DIAGNOSIS — E7800 Pure hypercholesterolemia, unspecified: Secondary | ICD-10-CM | POA: Diagnosis not present

## 2020-04-07 DIAGNOSIS — E1169 Type 2 diabetes mellitus with other specified complication: Secondary | ICD-10-CM | POA: Diagnosis not present

## 2020-04-07 DIAGNOSIS — K219 Gastro-esophageal reflux disease without esophagitis: Secondary | ICD-10-CM | POA: Diagnosis not present

## 2020-07-08 DIAGNOSIS — M654 Radial styloid tenosynovitis [de Quervain]: Secondary | ICD-10-CM | POA: Diagnosis not present

## 2020-07-29 ENCOUNTER — Other Ambulatory Visit (HOSPITAL_COMMUNITY): Payer: Self-pay | Admitting: Physician Assistant

## 2020-07-29 MED FILL — METFORMIN HCL 1000 MG TABS: 1000 | 90 days supply | Qty: 180 | Fill #0

## 2020-07-29 MED FILL — RYBELSUS 14 MG TABS: 14 | 90 days supply | Qty: 90 | Fill #0

## 2020-07-29 MED FILL — ESOMEPRAZOLE MAG DR 40 MG C: 40 | 90 days supply | Qty: 90 | Fill #0

## 2020-07-29 MED FILL — EZETIMIBE 10 MG TABS: 10 | 90 days supply | Qty: 90 | Fill #0

## 2020-07-29 MED FILL — ROSUVASTATIN CALCIUM 40 MG: 40 | 90 days supply | Qty: 90 | Fill #0

## 2020-07-29 MED FILL — LOSARTAN POTASSIUM-HCTZ 100: 100-25 | 30 days supply | Qty: 30 | Fill #0

## 2020-08-12 ENCOUNTER — Other Ambulatory Visit (HOSPITAL_COMMUNITY): Payer: Self-pay

## 2020-08-12 DIAGNOSIS — M79674 Pain in right toe(s): Secondary | ICD-10-CM | POA: Diagnosis not present

## 2020-08-12 DIAGNOSIS — M2241 Chondromalacia patellae, right knee: Secondary | ICD-10-CM | POA: Diagnosis not present

## 2020-08-12 MED ORDER — ETODOLAC 400 MG PO TABS
400.0000 mg | ORAL_TABLET | Freq: Two times a day (BID) | ORAL | 0 refills | Status: DC | PRN
  Filled 2020-08-12: qty 60, 30d supply, fill #0

## 2020-08-26 ENCOUNTER — Other Ambulatory Visit (HOSPITAL_COMMUNITY): Payer: Self-pay

## 2020-08-26 MED FILL — Empagliflozin Tab 25 MG: ORAL | 90 days supply | Qty: 90 | Fill #0 | Status: AC

## 2020-08-31 ENCOUNTER — Other Ambulatory Visit (HOSPITAL_COMMUNITY): Payer: Self-pay

## 2020-10-06 DIAGNOSIS — M5459 Other low back pain: Secondary | ICD-10-CM | POA: Diagnosis not present

## 2020-10-06 DIAGNOSIS — M7918 Myalgia, other site: Secondary | ICD-10-CM | POA: Diagnosis not present

## 2020-10-06 DIAGNOSIS — M9904 Segmental and somatic dysfunction of sacral region: Secondary | ICD-10-CM | POA: Diagnosis not present

## 2020-10-06 DIAGNOSIS — M9903 Segmental and somatic dysfunction of lumbar region: Secondary | ICD-10-CM | POA: Diagnosis not present

## 2020-10-06 DIAGNOSIS — M9905 Segmental and somatic dysfunction of pelvic region: Secondary | ICD-10-CM | POA: Diagnosis not present

## 2020-10-08 DIAGNOSIS — M5459 Other low back pain: Secondary | ICD-10-CM | POA: Diagnosis not present

## 2020-10-08 DIAGNOSIS — M9904 Segmental and somatic dysfunction of sacral region: Secondary | ICD-10-CM | POA: Diagnosis not present

## 2020-10-08 DIAGNOSIS — M9905 Segmental and somatic dysfunction of pelvic region: Secondary | ICD-10-CM | POA: Diagnosis not present

## 2020-10-08 DIAGNOSIS — M9903 Segmental and somatic dysfunction of lumbar region: Secondary | ICD-10-CM | POA: Diagnosis not present

## 2020-10-08 DIAGNOSIS — M7918 Myalgia, other site: Secondary | ICD-10-CM | POA: Diagnosis not present

## 2020-10-13 DIAGNOSIS — M9904 Segmental and somatic dysfunction of sacral region: Secondary | ICD-10-CM | POA: Diagnosis not present

## 2020-10-13 DIAGNOSIS — M9903 Segmental and somatic dysfunction of lumbar region: Secondary | ICD-10-CM | POA: Diagnosis not present

## 2020-10-13 DIAGNOSIS — M7918 Myalgia, other site: Secondary | ICD-10-CM | POA: Diagnosis not present

## 2020-10-13 DIAGNOSIS — M9905 Segmental and somatic dysfunction of pelvic region: Secondary | ICD-10-CM | POA: Diagnosis not present

## 2020-10-13 DIAGNOSIS — M5459 Other low back pain: Secondary | ICD-10-CM | POA: Diagnosis not present

## 2020-10-20 DIAGNOSIS — K219 Gastro-esophageal reflux disease without esophagitis: Secondary | ICD-10-CM | POA: Diagnosis not present

## 2020-10-20 DIAGNOSIS — Z6841 Body Mass Index (BMI) 40.0 and over, adult: Secondary | ICD-10-CM | POA: Diagnosis not present

## 2020-10-20 DIAGNOSIS — M109 Gout, unspecified: Secondary | ICD-10-CM | POA: Diagnosis not present

## 2020-10-20 DIAGNOSIS — I10 Essential (primary) hypertension: Secondary | ICD-10-CM | POA: Diagnosis not present

## 2020-10-20 DIAGNOSIS — Z7984 Long term (current) use of oral hypoglycemic drugs: Secondary | ICD-10-CM | POA: Diagnosis not present

## 2020-10-20 DIAGNOSIS — E7800 Pure hypercholesterolemia, unspecified: Secondary | ICD-10-CM | POA: Diagnosis not present

## 2020-10-20 DIAGNOSIS — E1169 Type 2 diabetes mellitus with other specified complication: Secondary | ICD-10-CM | POA: Diagnosis not present

## 2020-10-21 DIAGNOSIS — M7918 Myalgia, other site: Secondary | ICD-10-CM | POA: Diagnosis not present

## 2020-10-21 DIAGNOSIS — M9904 Segmental and somatic dysfunction of sacral region: Secondary | ICD-10-CM | POA: Diagnosis not present

## 2020-10-21 DIAGNOSIS — M9905 Segmental and somatic dysfunction of pelvic region: Secondary | ICD-10-CM | POA: Diagnosis not present

## 2020-10-21 DIAGNOSIS — M5459 Other low back pain: Secondary | ICD-10-CM | POA: Diagnosis not present

## 2020-10-21 DIAGNOSIS — M9903 Segmental and somatic dysfunction of lumbar region: Secondary | ICD-10-CM | POA: Diagnosis not present

## 2020-11-04 DIAGNOSIS — M9903 Segmental and somatic dysfunction of lumbar region: Secondary | ICD-10-CM | POA: Diagnosis not present

## 2020-11-04 DIAGNOSIS — M5459 Other low back pain: Secondary | ICD-10-CM | POA: Diagnosis not present

## 2020-11-04 DIAGNOSIS — M9905 Segmental and somatic dysfunction of pelvic region: Secondary | ICD-10-CM | POA: Diagnosis not present

## 2020-11-04 DIAGNOSIS — M7918 Myalgia, other site: Secondary | ICD-10-CM | POA: Diagnosis not present

## 2020-11-04 DIAGNOSIS — M9904 Segmental and somatic dysfunction of sacral region: Secondary | ICD-10-CM | POA: Diagnosis not present

## 2020-11-08 ENCOUNTER — Other Ambulatory Visit (HOSPITAL_COMMUNITY): Payer: Self-pay

## 2020-11-17 DIAGNOSIS — M25511 Pain in right shoulder: Secondary | ICD-10-CM | POA: Diagnosis not present

## 2020-11-18 DIAGNOSIS — M9905 Segmental and somatic dysfunction of pelvic region: Secondary | ICD-10-CM | POA: Diagnosis not present

## 2020-11-18 DIAGNOSIS — M7918 Myalgia, other site: Secondary | ICD-10-CM | POA: Diagnosis not present

## 2020-11-18 DIAGNOSIS — M9904 Segmental and somatic dysfunction of sacral region: Secondary | ICD-10-CM | POA: Diagnosis not present

## 2020-11-18 DIAGNOSIS — M5459 Other low back pain: Secondary | ICD-10-CM | POA: Diagnosis not present

## 2020-11-18 DIAGNOSIS — M9903 Segmental and somatic dysfunction of lumbar region: Secondary | ICD-10-CM | POA: Diagnosis not present

## 2020-11-24 DIAGNOSIS — H524 Presbyopia: Secondary | ICD-10-CM | POA: Diagnosis not present

## 2020-11-24 DIAGNOSIS — H40013 Open angle with borderline findings, low risk, bilateral: Secondary | ICD-10-CM | POA: Diagnosis not present

## 2020-12-29 DIAGNOSIS — M5459 Other low back pain: Secondary | ICD-10-CM | POA: Diagnosis not present

## 2020-12-29 DIAGNOSIS — M9904 Segmental and somatic dysfunction of sacral region: Secondary | ICD-10-CM | POA: Diagnosis not present

## 2020-12-29 DIAGNOSIS — M7918 Myalgia, other site: Secondary | ICD-10-CM | POA: Diagnosis not present

## 2020-12-29 DIAGNOSIS — M9903 Segmental and somatic dysfunction of lumbar region: Secondary | ICD-10-CM | POA: Diagnosis not present

## 2020-12-29 DIAGNOSIS — M9905 Segmental and somatic dysfunction of pelvic region: Secondary | ICD-10-CM | POA: Diagnosis not present

## 2021-01-06 ENCOUNTER — Other Ambulatory Visit (HOSPITAL_COMMUNITY): Payer: Self-pay

## 2021-01-06 DIAGNOSIS — M25512 Pain in left shoulder: Secondary | ICD-10-CM | POA: Diagnosis not present

## 2021-01-06 MED ORDER — CYCLOBENZAPRINE HCL 10 MG PO TABS
10.0000 mg | ORAL_TABLET | Freq: Every evening | ORAL | 0 refills | Status: DC | PRN
  Filled 2021-01-06: qty 30, 30d supply, fill #0

## 2021-01-20 ENCOUNTER — Other Ambulatory Visit (HOSPITAL_COMMUNITY): Payer: Self-pay

## 2021-01-20 MED FILL — Losartan Potassium & Hydrochlorothiazide Tab 100-25 MG: ORAL | 60 days supply | Qty: 60 | Fill #0 | Status: AC

## 2021-01-23 DIAGNOSIS — Z20822 Contact with and (suspected) exposure to covid-19: Secondary | ICD-10-CM | POA: Diagnosis not present

## 2021-01-27 ENCOUNTER — Ambulatory Visit: Payer: 59 | Admitting: Physical Therapy

## 2021-02-02 ENCOUNTER — Encounter: Payer: Self-pay | Admitting: Physical Therapy

## 2021-02-02 ENCOUNTER — Other Ambulatory Visit: Payer: Self-pay

## 2021-02-02 ENCOUNTER — Ambulatory Visit: Payer: 59 | Attending: Family Medicine | Admitting: Physical Therapy

## 2021-02-02 DIAGNOSIS — M25512 Pain in left shoulder: Secondary | ICD-10-CM | POA: Insufficient documentation

## 2021-02-02 DIAGNOSIS — G8929 Other chronic pain: Secondary | ICD-10-CM | POA: Diagnosis not present

## 2021-02-02 DIAGNOSIS — M542 Cervicalgia: Secondary | ICD-10-CM | POA: Diagnosis not present

## 2021-02-02 DIAGNOSIS — M6281 Muscle weakness (generalized): Secondary | ICD-10-CM | POA: Diagnosis not present

## 2021-02-02 NOTE — Patient Instructions (Signed)
Access Code: [email or web address] URL: [email or web address] Date: 02/02/2021 Prepared by: Rosana Hoes  Exercises Prone Scapular Slide with Shoulder Extension - 1 x daily - 7 x weekly - 3 sets - 5 reps - 5 hold Prone Shoulder Extension - Single Arm Prone T - 1 x daily - 7 x weekly - 3 sets - 5 reps - 5 hold Prone Single Arm Shoulder Horizontal Abduction with Scapular Retraction and Palm Down Standing Upper Trapezius Mobilization with Small Ball - 7 x weekly - 2-3 minutes hold

## 2021-02-03 ENCOUNTER — Encounter: Payer: Self-pay | Admitting: Physical Therapy

## 2021-02-03 NOTE — Therapy (Signed)
[facility] Outpatient Rehabilitation [facility] [address] [place], [place], [address] Phone: [phone]   Fax:  [phone]  Physical Therapy Evaluation  Patient Details  Name: Kerry Nelson MRN: [id number] Date of Birth: 1965-10-14 Referring Provider (PT): Irven Coe, MD   Encounter Date: 02/02/2021   PT End of Session - 02/02/21 1545     Visit Number 1    Number of Visits 12    Date for PT Re-Evaluation 03/16/21    Authorization Type [facility]    PT Start Time 1530    PT Stop Time 1615    PT Time Calculation (min) 45 min    Activity Tolerance Patient tolerated treatment well    Behavior During Therapy [facility] for tasks assessed/performed             Past Medical History:  Diagnosis Date   Chest pain    GERD (gastroesophageal reflux disease)    Hyperlipidemia    Hypertension    Prediabetes    Sleep apnea 2010   does not use Cpap    Past Surgical History:  Procedure Laterality Date   CARDIAC CATHETERIZATION  2010   Dr Allyson Sabal    CORRECTION HAMMER TOE Left    FOOT SURGERY     bone spur removal   HERNIA REPAIR     LIPOMA EXCISION     x2   SHOULDER ARTHROSCOPY WITH DISTAL CLAVICLE RESECTION Left 09/17/2012   Procedure: SHOULDER ARTHROSCOPY WITH DISTAL CLAVICLE RESECTION;  Surgeon: Valeria Batman, MD;  Location: [facility] OR;  Service: Orthopedics;  Laterality: Left;  Left shoulder arthroscopy, subacromial decompression, distal clavicle resection.   UMBILICAL HERNIA REPAIR      There were no vitals filed for this visit.    Subjective Assessment - 02/02/21 1534     Subjective Patient reports he feels like he is having knots in the trapezius area around the shoulder blade. This has happened twice this year, and has been severe enough where he can't lay on that side and it feels like his arm will fall off. States this current episode was feeling better because he wasn't working out, and then he worked out this past weekend and now it is more of a nagging  and pulling type pain more around the neck and top of the shoulder blade. Has tried modalities such as ice/heat, cream, patches with no relief.    Limitations Sitting;House hold activities;Lifting    How long can you sit comfortably? 20 minutes    Patient Stated Goals Help to minimize pain and find cause/solution of pain    Currently in Pain? Yes    Pain Score 1     Pain Location Shoulder    Pain Orientation Left    Pain Descriptors / Indicators Nagging    Pain Type Chronic pain    Pain Radiating Towards occasionally getting pain in to left upper arm    Pain Onset More than a month ago    Pain Frequency Constant    Aggravating Factors  Sitting extended periods, laying on back or left side, adjusting neck a certain way    Pain Relieving Factors Positioning the arm to where it is relaxed                [facility] PT Assessment - 02/03/21 0001       Assessment   Medical Diagnosis Pain in left shoulder    Referring Provider (PT) Irven Coe, MD    Onset Date/Surgical Date --  September of 2021   Hand Dominance Right    Prior Therapy No      Precautions   Precautions None      Restrictions   Weight Bearing Restrictions No      Balance Screen   Has the patient fallen in the past 6 months No    Has the patient had a decrease in activity level because of a fear of falling?  No    Is the patient reluctant to leave their home because of a fear of falling?  No      Prior Function   Level of Independence Independent    Vocation Full time employment    Vocation Requirements Mostly sitting/stationary during the day    Leisure Exercise, helping wife with heavy lifting      Cognition   Overall Cognitive Status Within Functional Limits for tasks assessed      Observation/Other Assessments   Observations Patient appears in no apparent distress    Focus on Therapeutic Outcomes (FOTO)  66% functional status      Sensation   Light Touch Appears Intact      Coordination   Gross  Motor Movements are Fluid and Coordinated Yes      Posture/Postural Control   Posture Comments Rounded shoulder and forward head posture      ROM / Strength   AROM / PROM / Strength AROM;Strength      AROM   Overall AROM Comments Shoulder AROM grossly WFL with slight shrug during elevation on left    AROM Assessment Site Cervical;Shoulder    Cervical Flexion 40    Cervical Extension 25 - pain    Cervical - Right Side Bend 25    Cervical - Left Side Bend 25 - pain    Cervical - Right Rotation 65    Cervical - Left Rotation 55 - pain      Strength   Overall Strength Comments Periscapular strength grossly 4-/5 on right with upper trap compensation, pain with upper trap muscle testing    Strength Assessment Site Shoulder    Right/Left Shoulder Right;Left    Right Shoulder Flexion 4+/5    Right Shoulder Extension 5/5    Right Shoulder ABduction 4+/5    Right Shoulder External Rotation 4+/5    Left Shoulder Flexion 5/5    Left Shoulder Extension 5/5    Left Shoulder ABduction 4+/5    Left Shoulder External Rotation 5/5      Palpation   Spinal mobility Not assessed    Palpation comment TTP left upper trap and levator region      Special Tests    Special Tests Cervical    Cervical Tests Spurling's      Spurling's   Findings Positive    Side Left      Transfers   Transfers Independent with all Transfers                        Objective measurements completed on examination: See above findings.      [facility] Adult PT Treatment/Exercise:  Therapeutic Exercise: - Prone I 5 x 5 sec - focus on scap depression - Prone T 5 x 5 sec - Instruction on SMFR using tennis ball  Manual Therapy: - NA  Neuromuscular re-ed: - NA  Therapeutic Activity: - NA  Modalities: - NA           PT Education - 02/02/21 1545     Education Details  Exam findings, POC, HEP, dry needling    Person(s) Educated Patient    Methods Explanation;Demonstration;Tactile  cues;Verbal cues;Handout    Comprehension Verbalized understanding;Returned demonstration;Verbal cues required;Tactile cues required;Need further instruction              PT Short Term Goals - 02/03/21 0816       PT SHORT TERM GOAL #1   Title STG = LTG               PT Long Term Goals - 02/03/21 0816       PT LONG TERM GOAL #1   Title Patient will be I with final HEP to maintain progress from PT    Time 6    Period Weeks    Status New    Target Date 03/16/21      PT LONG TERM GOAL #2   Title Patient will report >/= 75% status on FOTO to indicate improved functional status    Time 6    Period Weeks    Status New    Target Date 03/16/21      PT LONG TERM GOAL #3   Title Patient will report no limitation or increased neck/shoulder pain with sitting >/= 1 hour in order to improve working ability    Time 6    Period Weeks    Status New    Target Date 03/16/21      PT LONG TERM GOAL #4   Title Patient will demonstrate >/= 4+/5 MMT of left periscapular musculature in order to improve postural control and reduce pain with activity    Time 6    Period Weeks    Status New    Target Date 03/16/21                    Plan - 02/03/21 0820     Clinical Impression Statement Patient presents to PT with chronic left neck/shoulder pain. Patient's symptoms seem consistent with trapezius muscular pain, and he does exhibit cervical radicular symptoms that could be contributing to his pain. He exhibits limitation in cervical motion with pain on the left, TTP of left upper trap region with palpable trigger points, impaired periscapular strength and endurance with decreased postural control. Patient was provided exercises to initiate strength of the periscapular muscles and he would benefit from continued skilled PT to progress his strength and endurance in order to reduce pain and maximize his functional ability.    Personal Factors and Comorbidities Fitness;Past/Current  Experience;Time since onset of injury/illness/exacerbation    Examination-Activity Limitations Reach Overhead;Lift;Sleep    Examination-Participation Restrictions Occupation;Driving    Stability/Clinical Decision Making Stable/Uncomplicated    Clinical Decision Making Low    Rehab Potential Good    PT Frequency 2x / week    PT Duration 6 weeks    PT Treatment/Interventions ADLs/Self Care Home Management;Aquatic Therapy;Cryotherapy;Electrical Stimulation;Iontophoresis 4mg /ml Dexamethasone;Moist Heat;Traction;Ultrasound;Neuromuscular re-education;Balance training;Therapeutic exercise;Therapeutic activities;Functional mobility training;Stair training;Gait training;Patient/family education;Manual techniques;Dry needling;Passive range of motion;Taping;Vasopneumatic Device;Spinal Manipulations;Joint Manipulations    PT Next Visit Plan Review HEP and progress PRN, further cervical radic testing and treatment, manual/dry needling for left trap and cervical/thoracic mobility, progress periscapular strength and endurance    PT Home Exercise Plan    Consulted and Agree with Plan of Care Patient             Patient will benefit from skilled therapeutic intervention in order to improve the following deficits and impairments:  Decreased range of motion, Increased muscle spasms, Pain,  Decreased activity tolerance, Postural dysfunction, Decreased strength  Visit Diagnosis: Chronic left shoulder pain  Cervicalgia  Muscle weakness (generalized)     Problem List Patient Active Problem List   Diagnosis Date Noted   Left hip pain 06/19/2016   ELBOW PAIN, LEFT 08/05/2009   SHOULDER IMPINGEMENT SYNDROME 08/05/2009   LATERAL EPICONDYLITIS 08/05/2009   ANKLE PAIN, LEFT 02/10/2009   OTHER ACQUIRED DEFORMITY OF ANKLE AND FOOT OTHER 02/10/2009   OSTEOARTHRITIS, KNEE, RIGHT 03/10/2008   KNEE PAIN 03/10/2008   UNEQUAL LEG LENGTH 03/10/2008    Rosana Hoes, PT, DPT, LAT, ATC 02/03/21  8:28  AM Phone: [phone] Fax: [phone]   [facility] Outpatient Rehabilitation [facility] [address] [place], [place], [address] Phone: [phone]   Fax:  [phone]  Name: Kerry Nelson MRN: [id number] Date of Birth: 10/30/1965

## 2021-02-07 DIAGNOSIS — Z7984 Long term (current) use of oral hypoglycemic drugs: Secondary | ICD-10-CM | POA: Diagnosis not present

## 2021-02-07 DIAGNOSIS — E1169 Type 2 diabetes mellitus with other specified complication: Secondary | ICD-10-CM | POA: Diagnosis not present

## 2021-02-08 DIAGNOSIS — M7918 Myalgia, other site: Secondary | ICD-10-CM | POA: Diagnosis not present

## 2021-02-08 DIAGNOSIS — M9903 Segmental and somatic dysfunction of lumbar region: Secondary | ICD-10-CM | POA: Diagnosis not present

## 2021-02-08 DIAGNOSIS — M5459 Other low back pain: Secondary | ICD-10-CM | POA: Diagnosis not present

## 2021-02-08 DIAGNOSIS — M9904 Segmental and somatic dysfunction of sacral region: Secondary | ICD-10-CM | POA: Diagnosis not present

## 2021-02-08 DIAGNOSIS — M9905 Segmental and somatic dysfunction of pelvic region: Secondary | ICD-10-CM | POA: Diagnosis not present

## 2021-02-11 ENCOUNTER — Encounter: Payer: Self-pay | Admitting: Physical Therapy

## 2021-02-11 ENCOUNTER — Other Ambulatory Visit: Payer: Self-pay

## 2021-02-11 ENCOUNTER — Ambulatory Visit: Payer: 59 | Admitting: Physical Therapy

## 2021-02-11 DIAGNOSIS — M6281 Muscle weakness (generalized): Secondary | ICD-10-CM | POA: Diagnosis not present

## 2021-02-11 DIAGNOSIS — G8929 Other chronic pain: Secondary | ICD-10-CM | POA: Diagnosis not present

## 2021-02-11 DIAGNOSIS — M25512 Pain in left shoulder: Secondary | ICD-10-CM | POA: Diagnosis not present

## 2021-02-11 DIAGNOSIS — M542 Cervicalgia: Secondary | ICD-10-CM

## 2021-02-11 NOTE — Therapy (Signed)
[facility] Outpatient Rehabilitation [facility] [address] [place], [place], [address] Phone: [phone]   Fax:  [phone]  Physical Therapy Treatment  Patient Details  Name: Kerry Nelson MRN: [id number] Date of Birth: [birth year] Referring Provider (PT): Irven Coe, MD   Encounter Date: 02/11/2021   PT End of Session - 02/11/21 0912     Visit Number 2    Number of Visits 12    Date for PT Re-Evaluation 03/16/21    Authorization Type [facility]    PT Start Time 0915    PT Stop Time 0955    PT Time Calculation (min) 40 min    Activity Tolerance Patient tolerated treatment well    Behavior During Therapy [facility] for tasks assessed/performed             Past Medical History:  Diagnosis Date   Chest pain    GERD (gastroesophageal reflux disease)    Hyperlipidemia    Hypertension    Prediabetes    Sleep apnea 2010   does not use Cpap    Past Surgical History:  Procedure Laterality Date   CARDIAC CATHETERIZATION  2010   Dr Allyson Sabal    CORRECTION HAMMER TOE Left    FOOT SURGERY     bone spur removal   HERNIA REPAIR     LIPOMA EXCISION     x2   SHOULDER ARTHROSCOPY WITH DISTAL CLAVICLE RESECTION Left 09/17/2012   Procedure: SHOULDER ARTHROSCOPY WITH DISTAL CLAVICLE RESECTION;  Surgeon: Valeria Batman, MD;  Location: [facility] OR;  Service: Orthopedics;  Laterality: Left;  Left shoulder arthroscopy, subacromial decompression, distal clavicle resection.   UMBILICAL HERNIA REPAIR      There were no vitals filed for this visit.   Subjective Assessment - 02/11/21 0911     Subjective Patient reports he has been doing well. No new issues. He has been consistent with exercises and reports they are going well. He notes that today he is not having pain in the shoulder/neck region but does occasional get flare ups that go away relative quickly once he makes an adjustment.    Patient Stated Goals Help to minimize pain and find cause/solution of pain    Currently in  Pain? Yes    Pain Score 0-No pain    Pain Location Shoulder    Pain Orientation Left    Pain Type Chronic pain    Pain Onset More than a month ago    Pain Frequency Constant                          [facility] Adult PT Treatment/Exercise:  Therapeutic Exercise: - UBE L1 x 4 min (2 fwd/bwd) - Doorway pec stretch at 90 deg 3 x 30 sec - Standing extension + scap retraction with green 10 x 3 sec hold - Supine chin tuck 10 x 3 sec hold - Sidelying thoracic rotation - patient reported increased discomfort so d/c'd -Reviewed current HEP exercises and instructed on using theracane  Manual Therapy: - STM and myofascial releases for left upper trap, levator, rhomboids, mid traps, infraspinatus - Gentle suboccipital release with traction x 3 bouts  Neuromuscular re-ed: - N/A  Therapeutic Activity: - N/A  Modalities: - N/A               PT Education - 02/11/21 0912     Education Details HEP update    Person(s) Educated Patient    Methods Explanation;Demonstration;Verbal cues;Handout  Comprehension Verbalized understanding;Returned demonstration;Verbal cues required;Need further instruction              PT Short Term Goals - 02/03/21 0816       PT SHORT TERM GOAL #1   Title STG = LTG               PT Long Term Goals - 02/03/21 0816       PT LONG TERM GOAL #1   Title Patient will be I with final HEP to maintain progress from PT    Time 6    Period Weeks    Status New    Target Date 03/16/21      PT LONG TERM GOAL #2   Title Patient will report >/= 75% status on FOTO to indicate improved functional status    Time 6    Period Weeks    Status New    Target Date 03/16/21      PT LONG TERM GOAL #3   Title Patient will report no limitation or increased neck/shoulder pain with sitting >/= 1 hour in order to improve working ability    Time 6    Period Weeks    Status New    Target Date 03/16/21      PT LONG TERM GOAL #4   Title  Patient will demonstrate >/= 4+/5 MMT of left periscapular musculature in order to improve postural control and reduce pain with activity    Time 6    Period Weeks    Status New    Target Date 03/16/21                   Plan - 02/11/21 0912     Clinical Impression Statement Patient tolerated therapy well with no avderse effects. Performed manual soft tissue work for left periscapular region with good benefit. Continued progress for postural strengthening and updated HEP with further resistance and cervical endurance training. He did report pain with thoracic rotation and left sidelying so discontinued exercise and will reattempt in future. He would benefit from continued skilled PT to progress his strength and endurance in order to reduce pain and maximize his functional ability.    PT Treatment/Interventions ADLs/Self Care Home Management;Aquatic Therapy;Cryotherapy;Electrical Stimulation;Iontophoresis 4mg /ml Dexamethasone;Moist Heat;Traction;Ultrasound;Neuromuscular re-education;Balance training;Therapeutic exercise;Therapeutic activities;Functional mobility training;Stair training;Gait training;Patient/family education;Manual techniques;Dry needling;Passive range of motion;Taping;Vasopneumatic Device;Spinal Manipulations;Joint Manipulations    PT Next Visit Plan Review HEP and progress PRN, further cervical radic testing and treatment, manual/dry needling for left trap and cervical/thoracic mobility, progress periscapular strength and endurance    PT Home Exercise Plan    Consulted and Agree with Plan of Care Patient             Patient will benefit from skilled therapeutic intervention in order to improve the following deficits and impairments:  Decreased range of motion, Increased muscle spasms, Pain, Decreased activity tolerance, Postural dysfunction, Decreased strength  Visit Diagnosis: Chronic left shoulder pain  Cervicalgia  Muscle weakness  (generalized)     Problem List Patient Active Problem List   Diagnosis Date Noted   Left hip pain 06/19/2016   ELBOW PAIN, LEFT 08/05/2009   SHOULDER IMPINGEMENT SYNDROME 08/05/2009   LATERAL EPICONDYLITIS 08/05/2009   ANKLE PAIN, LEFT 02/10/2009   OTHER ACQUIRED DEFORMITY OF ANKLE AND FOOT OTHER 02/10/2009   OSTEOARTHRITIS, KNEE, RIGHT 03/10/2008   KNEE PAIN 03/10/2008   UNEQUAL LEG LENGTH 03/10/2008    13/01/2008, PT, DPT, LAT, ATC 02/11/21  9:59 AM Phone:  [phone] Fax: [phone]   [facility] Outpatient Rehabilitation [facility] [address] [place], [place], [address] Phone: [phone]   Fax:  [phone]  Name: Kerry Nelson MRN: [id number] Date of Birth: 08-29-1965

## 2021-02-11 NOTE — Patient Instructions (Signed)
Access Code: [email or web address] URL: [email or web address] Date: 02/11/2021 Prepared by: Rosana Hoes  Exercises Prone Scapular Slide with Shoulder Extension - 1 x daily - 7 x weekly - 3 sets - 5 reps - 5 hold Prone Shoulder Extension - Single Arm Prone T - 1 x daily - 7 x weekly - 3 sets - 5 reps - 5 hold Prone Single Arm Shoulder Horizontal Abduction with Scapular Retraction and Palm Down Standing Upper Trapezius Mobilization with Small Ball - 7 x weekly - 2-3 minutes hold Scapular Retraction with Resistance Advanced - 1 x daily - 7 x weekly - 2 sets - 10 reps - 3 hold Supine Chin Tucks on Flat Ball - 1 x daily - 7 x weekly - 2 sets - 10 reps - 3 hold Doorway Pec Stretch at 90 Degrees Abduction - 1 x daily - 7 x weekly - 3 reps - 30 hold

## 2021-02-16 ENCOUNTER — Ambulatory Visit: Payer: 59 | Admitting: Physical Therapy

## 2021-02-22 ENCOUNTER — Other Ambulatory Visit: Payer: Self-pay

## 2021-02-22 ENCOUNTER — Ambulatory Visit: Payer: 59 | Admitting: Physical Therapy

## 2021-02-22 ENCOUNTER — Encounter: Payer: Self-pay | Admitting: Physical Therapy

## 2021-02-22 DIAGNOSIS — G8929 Other chronic pain: Secondary | ICD-10-CM

## 2021-02-22 DIAGNOSIS — M25512 Pain in left shoulder: Secondary | ICD-10-CM | POA: Diagnosis not present

## 2021-02-22 DIAGNOSIS — M6281 Muscle weakness (generalized): Secondary | ICD-10-CM | POA: Diagnosis not present

## 2021-02-22 DIAGNOSIS — M542 Cervicalgia: Secondary | ICD-10-CM

## 2021-02-22 NOTE — Patient Instructions (Signed)
Access Code: [email or web address] URL: [email or web address] Date: 02/22/2021 Prepared by: Rosana Hoes  Exercises Prone Scapular Slide with Shoulder Extension - 1 x daily - 7 x weekly - 3 sets - 5 reps - 5 hold Prone Shoulder Extension - Single Arm Prone T - 1 x daily - 7 x weekly - 3 sets - 5 reps - 5 hold Prone Single Arm Shoulder Horizontal Abduction with Scapular Retraction and Palm Down Standing Upper Trapezius Mobilization with Small Ball - 7 x weekly - 2-3 minutes hold Scapular Retraction with Resistance Advanced - 1 x daily - 7 x weekly - 2 sets - 10 reps - 3 hold Supine Chin Tucks on Flat Ball - 1 x daily - 7 x weekly - 2 sets - 10 reps - 3 hold Doorway Pec Stretch at 90 Degrees Abduction - 1 x daily - 7 x weekly - 3 reps - 30 hold Shoulder External Rotation and Scapular Retraction with Resistance - 1 x daily - 7 x weekly - 2 sets - 12 reps Quadruped Scapular Protraction and Retraction - 1 x daily - 7 x weekly - 2 sets - 10 reps

## 2021-02-22 NOTE — Therapy (Signed)
[facility] Outpatient Rehabilitation [facility] [address] [place], [place], [address] Phone: [phone]   Fax:  [phone]  Physical Therapy Treatment  Patient Details  Name: Kerry Nelson MRN: [id number] Date of Birth: 27-Feb-1966 Referring Provider (PT): Irven Coe, MD   Encounter Date: 02/22/2021   PT End of Session - 02/22/21 1646     Visit Number 3    Number of Visits 12    Date for PT Re-Evaluation 03/16/21    Authorization Type [facility] UMR    PT Start Time 1615    PT Stop Time 1700   5 minutes TPDN performed   PT Time Calculation (min) 45 min    Activity Tolerance Patient tolerated treatment well    Behavior During Therapy [facility] for tasks assessed/performed             Past Medical History:  Diagnosis Date   Chest pain    GERD (gastroesophageal reflux disease)    Hyperlipidemia    Hypertension    Prediabetes    Sleep apnea 2010   does not use Cpap    Past Surgical History:  Procedure Laterality Date   CARDIAC CATHETERIZATION  2010   Dr Allyson Sabal    CORRECTION HAMMER TOE Left    FOOT SURGERY     bone spur removal   HERNIA REPAIR     LIPOMA EXCISION     x2   SHOULDER ARTHROSCOPY WITH DISTAL CLAVICLE RESECTION Left 09/17/2012   Procedure: SHOULDER ARTHROSCOPY WITH DISTAL CLAVICLE RESECTION;  Surgeon: Valeria Batman, MD;  [facility];  Service: Orthopedics;  Laterality: Left;  Left shoulder arthroscopy, subacromial decompression, distal clavicle resection.   UMBILICAL HERNIA REPAIR      There were no vitals filed for this visit.   Subjective Assessment - 02/22/21 1618     Subjective Patient reports he is doing well. He does get occasional flareups with the neck/shoulder mainly when he lays on it, but no other new issues. He states he is a little sore in the lower back from powerwashing his driveway.    Patient Stated Goals Help to minimize pain and find cause/solution of pain    Currently in Pain? No/denies    Pain Score 0-No pain     Pain Location Shoulder    Pain Orientation Left                [facility] PT Assessment - 02/22/21 0001       AROM   Cervical Extension 35    Cervical - Right Side Bend 35   tightness reported left upper trap   Cervical - Left Side Bend 35   pain left neck/shoulder   Cervical - Left Rotation 65   tightness reported left upper trap     Strength   Overall Strength Comments Periscapular strength grossly 4/5      Palpation   Palpation comment TTP left upper trap, infraspinatus with referred pain to left cervical region                   [facility] Adult PT Treatment/Exercise:  Therapeutic Exercise: UBE L1 x 4 min (2 fwd/bwd) Row machine (vertical handles) 45# 2 x 12 Double ER+scap retraction with blue 2 x 12 Prone T thumb up with 2# 2 x 10 Quadruped push-up plus 2 x 10  Manual Therapy: Skilled palpation and monitoring throughout dry needling treatment STM and myofascial release for left upper trap, levator scap, rhomboids, infraspinatus  Neuromuscular re-ed: N/A  Therapeutic Activity: N/A  Modalities: N/A  Self Care: N/A  Trigger Point Dry Needling Treatment: Pre-treatment instruction: Patient instructed on dry needling rationale, procedures, and possible side effects including pain during treatment (achy,cramping feeling), bruising, drop of blood, lightheadedness, nausea, sweating. Patient Consent Given: Yes Education handout provided: No Muscles treated: left upper trap and infraspinatus  Needle size and number: .30x8mm x 3 Electrical stimulation performed: No Parameters: N/A Treatment response/outcome: Twitch response elicited, Palpable decrease in muscle tension, and patient report of improved symptoms Post-treatment instructions: Patient instructed to expect possible mild to moderate muscle soreness later today and/or tomorrow. Patient instructed in methods to reduce muscle soreness and to continue prescribed HEP. If patient was dry needled over the  lung field, patient was instructed on signs and symptoms of pneumothorax and, however unlikely, to see immediate medical attention should they occur. Patient was also educated on signs and symptoms of infection and to seek medical attention should they occur. Patient verbalized understanding of these instructions and education.                 PT Education - 02/22/21 1620     Education Details HEP update, dry needling    Person(s) Educated Patient    Methods Explanation;Demonstration;Verbal cues;Handout;Tactile cues    Comprehension Verbalized understanding;Returned demonstration;Verbal cues required;Need further instruction;Tactile cues required              PT Short Term Goals - 02/03/21 0816       PT SHORT TERM GOAL #1   Title STG = LTG               PT Long Term Goals - 02/22/21 1703       PT LONG TERM GOAL #1   Title Patient will be I with final HEP to maintain progress from PT    Baseline progressing with HEP    Time 6    Period Weeks    Status On-going    Target Date 03/16/21      PT LONG TERM GOAL #2   Title Patient will report >/= 75% status on FOTO to indicate improved functional status    Baseline not reassessed    Time 6    Period Weeks    Status On-going    Target Date 03/16/21      PT LONG TERM GOAL #3   Title Patient will report no limitation or increased neck/shoulder pain with sitting >/= 1 hour in order to improve working ability    Baseline Patient continues with neck/shoulder tightness with extended periods of sitting    Time 6    Period Weeks    Status On-going    Target Date 03/16/21      PT LONG TERM GOAL #4   Title Patient will demonstrate >/= 4+/5 MMT of left periscapular musculature in order to improve postural control and reduce pain with activity    Baseline grossly 4/5 MMT    Time 6    Period Weeks    Status On-going    Target Date 03/16/21                   Plan - 02/22/21 1648     Clinical  Impression Statement Patient tolerated therapy well with no avderse effects. Performed dry needling this visit for left upper trap and infraspinatus region, twicth response elicited both locations with patient reporting improvement in symptoms. Continued focus on progressing periscapular strength and endurance. Patient demonstrates improvement in strength but with  continued deficit and decreased seated tolerance. Updated HEP this visit to progress strength and endurance. He would benefit from continued skilled PT to progress his strength and endurance in order to reduce pain and maximize his functional ability.    PT Treatment/Interventions ADLs/Self Care Home Management;Aquatic Therapy;Cryotherapy;Electrical Stimulation;Iontophoresis 4mg /ml Dexamethasone;Moist Heat;Traction;Ultrasound;Neuromuscular re-education;Balance training;Therapeutic exercise;Therapeutic activities;Functional mobility training;Stair training;Gait training;Patient/family education;Manual techniques;Dry needling;Passive range of motion;Taping;Vasopneumatic Device;Spinal Manipulations;Joint Manipulations    PT Next Visit Plan Review HEP and progress PRN, further cervical radic testing and treatment, manual/dry needling for left trap and cervical/thoracic mobility, progress periscapular strength and endurance    PT Home Exercise Plan    Consulted and Agree with Plan of Care Patient             Patient will benefit from skilled therapeutic intervention in order to improve the following deficits and impairments:  Decreased range of motion, Increased muscle spasms, Pain, Decreased activity tolerance, Postural dysfunction, Decreased strength  Visit Diagnosis: Chronic left shoulder pain  Cervicalgia  Muscle weakness (generalized)     Problem List Patient Active Problem List   Diagnosis Date Noted   Left hip pain 06/19/2016   ELBOW PAIN, LEFT 08/05/2009   SHOULDER IMPINGEMENT SYNDROME 08/05/2009   LATERAL  EPICONDYLITIS 08/05/2009   ANKLE PAIN, LEFT 02/10/2009   OTHER ACQUIRED DEFORMITY OF ANKLE AND FOOT OTHER 02/10/2009   OSTEOARTHRITIS, KNEE, RIGHT 03/10/2008   KNEE PAIN 03/10/2008   UNEQUAL LEG LENGTH 03/10/2008    13/01/2008, PT, DPT, LAT, ATC 02/22/21  5:14 PM Phone: [phone] Fax: [phone]   [facility] [address] [place], [place], [place] Phone: [phone]   Fax:  [phone]  Name: Kerry Nelson MRN: Suzanne Boron Date of Birth: 1965/09/05

## 2021-02-28 ENCOUNTER — Ambulatory Visit: Payer: 59 | Admitting: Physical Therapy

## 2021-02-28 ENCOUNTER — Telehealth: Payer: Self-pay | Admitting: Physical Therapy

## 2021-02-28 NOTE — Telephone Encounter (Signed)
Called pt today regarding missed appointment. He stated he completely forgot about it. Reviewed the next appointment day and time which he stated he plans to attend.   Crystal Ellwood PT, DPT, LAT, ATC  02/28/21  4:30 PM

## 2021-03-08 DIAGNOSIS — M9905 Segmental and somatic dysfunction of pelvic region: Secondary | ICD-10-CM | POA: Diagnosis not present

## 2021-03-08 DIAGNOSIS — M5459 Other low back pain: Secondary | ICD-10-CM | POA: Diagnosis not present

## 2021-03-08 DIAGNOSIS — M9904 Segmental and somatic dysfunction of sacral region: Secondary | ICD-10-CM | POA: Diagnosis not present

## 2021-03-08 DIAGNOSIS — M9903 Segmental and somatic dysfunction of lumbar region: Secondary | ICD-10-CM | POA: Diagnosis not present

## 2021-03-08 DIAGNOSIS — M7918 Myalgia, other site: Secondary | ICD-10-CM | POA: Diagnosis not present

## 2021-03-09 ENCOUNTER — Encounter: Payer: Self-pay | Admitting: Physical Therapy

## 2021-03-09 ENCOUNTER — Ambulatory Visit: Payer: 59 | Attending: Family Medicine | Admitting: Physical Therapy

## 2021-03-09 ENCOUNTER — Other Ambulatory Visit: Payer: Self-pay

## 2021-03-09 DIAGNOSIS — M6281 Muscle weakness (generalized): Secondary | ICD-10-CM | POA: Insufficient documentation

## 2021-03-09 DIAGNOSIS — G8929 Other chronic pain: Secondary | ICD-10-CM | POA: Insufficient documentation

## 2021-03-09 DIAGNOSIS — M542 Cervicalgia: Secondary | ICD-10-CM | POA: Insufficient documentation

## 2021-03-09 DIAGNOSIS — M25512 Pain in left shoulder: Secondary | ICD-10-CM | POA: Insufficient documentation

## 2021-03-09 NOTE — Therapy (Signed)
[facility] Outpatient Rehabilitation [facility] [address] [place], [place], [address] Phone: [phone]   Fax:  [phone]  Physical Therapy Treatment  Patient Details  Name: Kerry Nelson MRN: [id number] Date of Birth: 11-18-1965 Referring Provider (PT): Irven Coe, MD   Encounter Date: 03/09/2021   PT End of Session - 03/09/21 1543     Visit Number 4    Number of Visits 12    Date for PT Re-Evaluation 03/16/21    Authorization Type [facility] U[facility]    PT Start Time 1605    PT Stop Time 1645    PT Time Calculation (min) 40 min    Activity Tolerance Patient tolerated treatment well    Behavior During Therapy [facility] for tasks assessed/performed             Past Medical History:  Diagnosis Date   Chest pain    GERD (gastroesophageal reflux disease)    Hyperlipidemia    Hypertension    Prediabetes    Sleep apnea 2010   does not use Cpap    Past Surgical History:  Procedure Laterality Date   CARDIAC CATHETERIZATION  2010   Dr Allyson Sabal    CORRECTION HAMMER TOE Left    FOOT SURGERY     bone spur removal   HERNIA REPAIR     LIPOMA EXCISION     x2   SHOULDER ARTHROSCOPY WITH DISTAL CLAVICLE RESECTION Left 09/17/2012   Procedure: SHOULDER ARTHROSCOPY WITH DISTAL CLAVICLE RESECTION;  Surgeon: Valeria Batman, MD;  [facility];  Service: Orthopedics;  Laterality: Left;  Left shoulder arthroscopy, subacromial decompression, distal clavicle resection.   UMBILICAL HERNIA REPAIR      There were no vitals filed for this visit.   Subjective Assessment - 03/09/21 1607     Subjective Patien reports he has been feeling really good, he has been exercising with no problem pain wise. States no problem following the dry needling last visit.    Patient Stated Goals Help to minimize pain and find cause/solution of pain    Currently in Pain? No/denies                [facility] PT Assessment - 03/09/21 0001       Strength   Overall Strength Comments Periscapular  strength grossly 4/5                    [facility] Adult PT Treatment/Exercise:   Therapeutic Exercise: UBE L1 x 4 min (2 fwd/bwd) Doorway pec stretch 3 x 20 sec Row machine (vertical handles) 45# 2 x 8 Row machine (horizontal handles) 45# 2 x 8 Extension + scap retraction with blue  Horizontal abduction with anchored red 2 x 10 High row + ER at 90 deg with red 2 x 8 Double ER+scap retraction with blue 2 x 12 Wall clock with blue 2 x 5 1/2 kneeling overhead press with 5# bottom up kettlebell   Manual Therapy: N/A   Neuromuscular re-ed: N/A   Therapeutic Activity: N/A   Modalities: N/A   Self Care: N/A                  PT Education - 03/09/21 1542     Education Details HEP    Person(s) Educated Patient    Methods Explanation;Demonstration;Verbal cues    Comprehension Verbalized understanding;Returned demonstration;Verbal cues required;Need further instruction              PT Short Term Goals - 02/03/21 6203  PT SHORT TERM GOAL #1   Title STG = LTG               PT Long Term Goals - 02/22/21 1703       PT LONG TERM GOAL #1   Title Patient will be I with final HEP to maintain progress from PT    Baseline progressing with HEP    Time 6    Period Weeks    Status On-going    Target Date 03/16/21      PT LONG TERM GOAL #2   Title Patient will report >/= 75% status on FOTO to indicate improved functional status    Baseline not reassessed    Time 6    Period Weeks    Status On-going    Target Date 03/16/21      PT LONG TERM GOAL #3   Title Patient will report no limitation or increased neck/shoulder pain with sitting >/= 1 hour in order to improve working ability    Baseline Patient continues with neck/shoulder tightness with extended periods of sitting    Time 6    Period Weeks    Status On-going    Target Date 03/16/21      PT LONG TERM GOAL #4   Title Patient will demonstrate >/= 4+/5 MMT of left periscapular  musculature in order to improve postural control and reduce pain with activity    Baseline grossly 4/5 MMT    Time 6    Period Weeks    Status On-going    Target Date 03/16/21                   Plan - 03/09/21 1544     Clinical Impression Statement Patient tolerated therapy well with no avderse effects. Therapy focused on progressing periscapular and shoulder strength with good tolerance. He did demonstrate some lat tightness with overhead pressing so will address at next visit. He would benefit from continued skilled PT to progress his strength and endurance in order to reduce pain and maximize his functional ability.    PT Treatment/Interventions ADLs/Self Care Home Management;Aquatic Therapy;Cryotherapy;Electrical Stimulation;Iontophoresis 4mg /ml Dexamethasone;Moist Heat;Traction;Ultrasound;Neuromuscular re-education;Balance training;Therapeutic exercise;Therapeutic activities;Functional mobility training;Stair training;Gait training;Patient/family education;Manual techniques;Dry needling;Passive range of motion;Taping;Vasopneumatic Device;Spinal Manipulations;Joint Manipulations    PT Next Visit Plan Review HEP and progress PRN, further cervical radic testing and treatment, manual/dry needling for left trap and cervical/thoracic mobility, progress periscapular strength and endurance    PT Home Exercise Plan    Consulted and Agree with Plan of Care Patient             Patient will benefit from skilled therapeutic intervention in order to improve the following deficits and impairments:  Decreased range of motion, Increased muscle spasms, Pain, Decreased activity tolerance, Postural dysfunction, Decreased strength  Visit Diagnosis: Chronic left shoulder pain  Cervicalgia  Muscle weakness (generalized)     Problem List Patient Active Problem List   Diagnosis Date Noted   Left hip pain 06/19/2016   ELBOW PAIN, LEFT 08/05/2009   SHOULDER IMPINGEMENT SYNDROME  08/05/2009   LATERAL EPICONDYLITIS 08/05/2009   ANKLE PAIN, LEFT 02/10/2009   OTHER ACQUIRED DEFORMITY OF ANKLE AND FOOT OTHER 02/10/2009   OSTEOARTHRITIS, KNEE, RIGHT 03/10/2008   KNEE PAIN 03/10/2008   UNEQUAL LEG LENGTH 03/10/2008    13/01/2008, PT, DPT, LAT, ATC 03/09/21  4:48 PM Phone: [phone] Fax: [phone]   [facility] Outpatient Rehabilitation [facility] [address] [place], [place], [place] Phone:  [phone]   Fax:  [phone]  Name: Kerry Nelson MRN: [id number] Date of Birth: 30-Jun-1965

## 2021-03-14 ENCOUNTER — Encounter: Payer: Self-pay | Admitting: Physical Therapy

## 2021-03-14 ENCOUNTER — Other Ambulatory Visit: Payer: Self-pay

## 2021-03-14 ENCOUNTER — Ambulatory Visit: Payer: 59 | Admitting: Physical Therapy

## 2021-03-14 DIAGNOSIS — M6281 Muscle weakness (generalized): Secondary | ICD-10-CM

## 2021-03-14 DIAGNOSIS — G8929 Other chronic pain: Secondary | ICD-10-CM

## 2021-03-14 DIAGNOSIS — M542 Cervicalgia: Secondary | ICD-10-CM

## 2021-03-14 DIAGNOSIS — M25512 Pain in left shoulder: Secondary | ICD-10-CM

## 2021-03-14 NOTE — Therapy (Signed)
[place], [place], [address] Phone: [phone]   Fax:  [phone]  Physical Therapy Treatment / Discharge  Patient Details  Name: Kerry Nelson MRN: [id number] Date of Birth: [birth year] Referring Provider (PT): Collene Leyden, MD   Encounter Date: 03/14/2021   PT End of Session - 03/14/21 1618     Visit Number 5    Number of Visits 12    Date for PT Re-Evaluation 03/16/21    Authorization Type [facility] U[facility]    PT Start Time 2297    PT Stop Time 1655    PT Time Calculation (min) 40 min    Activity Tolerance Patient tolerated treatment well    Behavior During Therapy [facility] for tasks assessed/performed             Past Medical History:  Diagnosis Date   Chest pain    GERD (gastroesophageal reflux disease)    Hyperlipidemia    Hypertension    Prediabetes    Sleep apnea 2010   does not use Cpap    Past Surgical History:  Procedure Laterality Date   CARDIAC CATHETERIZATION  2010   Dr Gwenlyn Found    CORRECTION HAMMER TOE Left    FOOT SURGERY     bone spur removal   HERNIA REPAIR     LIPOMA EXCISION     x2   SHOULDER ARTHROSCOPY WITH DISTAL CLAVICLE RESECTION Left 09/17/2012   Procedure: SHOULDER ARTHROSCOPY WITH DISTAL CLAVICLE RESECTION;  Surgeon: Garald Balding, MD;  Location: [place];  Service: Orthopedics;  Laterality: Left;  Left shoulder arthroscopy, subacromial decompression, distal clavicle resection.   UMBILICAL HERNIA REPAIR      There were no vitals filed for this visit.   Subjective Assessment - 03/14/21 1617     Subjective Patient reports he is doing well with no new issues. States he has not pain at rest and it will occasionally nag him if he lies on the left shoulder but this is not consistent.    Patient Stated Goals Help to minimize pain and find cause/solution of pain    Currently in Pain? No/denies                [facility] PT Assessment - 03/14/21 0001       Assessment    Medical Diagnosis Pain in left shoulder    Referring Provider (PT) Collene Leyden, MD      Precautions   Precautions None      Restrictions   Weight Bearing Restrictions No      Balance Screen   Has the patient fallen in the past 6 months No      Prior Function   Level of Independence Independent      Cognition   Overall Cognitive Status Within Functional Limits for tasks assessed      Observation/Other Assessments   Observations Patient appears in no apparent distress    Focus on Therapeutic Outcomes (FOTO)  87% functional status      AROM   Overall AROM Comments Shoulder AROM grossly WFL and non-painful      Strength   Overall Strength Comments Periscapular strength grossly 4+/5 MMT                   [facility] Adult PT Treatment/Exercise:  Therapeutic Exercise: UBE L4 x 4 min (2 fwd/bwd) while taking subjective Scap pinned shoulder elevation stretching Sidelying thoracic rotation x 5 each Quadruped thoracic rotation full range x 5  each Row with black x 15 Face pull with red x 10 Horizontal abduction with yellow x 15 Lat pull down straight arm with green x 10  Manual Therapy: N/A  Neuromuscular re-ed: N/A  Therapeutic Activity: N/A  Modalities: N/A  Self Care: N/A                  PT Education - 03/14/21 1617     Education Details POC for discharge, FOTO, HEP update    Person(s) Educated Patient    Methods Explanation;Demonstration;Verbal cues;Handout    Comprehension Verbalized understanding;Returned demonstration;Verbal cues required;Need further instruction              PT Short Term Goals - 02/03/21 0816       PT SHORT TERM GOAL #1   Title STG = LTG               PT Long Term Goals - 03/14/21 1624       PT LONG TERM GOAL #1   Title Patient will be I with final HEP to maintain progress from PT    Baseline independent with HEP    Time 6    Period Weeks    Status Achieved      PT LONG TERM GOAL #2   Title  Patient will report >/= 75% status on FOTO to indicate improved functional status    Baseline 87%    Time 6    Period Weeks    Status Achieved      PT LONG TERM GOAL #3   Title Patient will report no limitation or increased neck/shoulder pain with sitting >/= 1 hour in order to improve working ability    Baseline Patient reports reports no limitation with sitting, he can change position to make sure he remains comfortable.    Time 6    Period Weeks    Status Achieved      PT LONG TERM GOAL #4   Title Patient will demonstrate >/= 4+/5 MMT of left periscapular musculature in order to improve postural control and reduce pain with activity    Baseline grossly 4+/5 MMT    Time 6    Period Weeks    Status Achieved                   Plan - 03/14/21 1618     Clinical Impression Statement Patient tolerated therapy well with no adverse effects. He has achieved all established goals and reports no functional limitations due to shoulder/back pain. He demonstrates much improved strength, tolerance for sitting and exercise, and functional ability per FOTO. Patient will be formally discharged from PT as therapy is no longer indicated.    PT Treatment/Interventions ADLs/Self Care Home Management;Aquatic Therapy;Cryotherapy;Electrical Stimulation;Iontophoresis 4mg /ml Dexamethasone;Moist Heat;Traction;Ultrasound;Neuromuscular re-education;Balance training;Therapeutic exercise;Therapeutic activities;Functional mobility training;Stair training;Gait training;Patient/family education;Manual techniques;Dry needling;Passive range of motion;Taping;Vasopneumatic Device;Spinal Manipulations;Joint Manipulations    PT Next Visit Plan NA - discharge    PT Home Exercise Plan [email or web address]    Consulted and Agree with Plan of Care Patient             Patient will benefit from skilled therapeutic intervention in order to improve the following deficits and impairments:  Decreased range of motion, Increased  muscle spasms, Pain, Decreased activity tolerance, Postural dysfunction, Decreased strength  Visit Diagnosis: Chronic left shoulder pain  Cervicalgia  Muscle weakness (generalized)     Problem List Patient Active Problem List   Diagnosis Date Noted   Left hip pain 06/19/2016  ELBOW PAIN, LEFT 08/05/2009   SHOULDER IMPINGEMENT SYNDROME 08/05/2009   LATERAL EPICONDYLITIS 08/05/2009   ANKLE PAIN, LEFT 02/10/2009   OTHER ACQUIRED DEFORMITY OF ANKLE AND FOOT OTHER 02/10/2009   OSTEOARTHRITIS, KNEE, RIGHT 03/10/2008   KNEE PAIN 03/10/2008   UNEQUAL LEG LENGTH 03/10/2008    Hilda Blades, PT, DPT, LAT, ATC 03/14/21  5:02 PM Phone: [phone] Fax: [facility] [address] [place], [place], [address] Phone: [phone]   Fax:  [phone]  Name: QUINCEY QUESINBERRY MRN: [id number] Date of Birth: 11/25/1965   PHYSICAL THERAPY DISCHARGE SUMMARY  Visits from Start of Care: 5  Current functional level related to goals / functional outcomes: See above   Remaining deficits: See above   Education / Equipment: HEP   Patient agrees to discharge. Patient goals were met. Patient is being discharged due to meeting the stated rehab goals.

## 2021-03-14 NOTE — Patient Instructions (Signed)
Access Code: [email or web address] URL: [email or web address] Date: 03/14/2021 Prepared by: Rosana Hoes  Exercises Prone Scapular Slide with Shoulder Extension - 1 x daily - 7 x weekly - 3 sets - 5 reps - 5 hold Prone Shoulder Extension - Single Arm Prone T - 1 x daily - 7 x weekly - 3 sets - 5 reps - 5 hold Prone Single Arm Shoulder Horizontal Abduction with Scapular Retraction and Palm Down Standing Upper Trapezius Mobilization with Small Ball - 7 x weekly - 2-3 minutes hold Scapular Retraction with Resistance Advanced - 1 x daily - 7 x weekly - 2 sets - 10 reps - 3 hold Supine Chin Tucks on Flat Ball - 1 x daily - 7 x weekly - 2 sets - 10 reps - 3 hold Doorway Pec Stretch at 90 Degrees Abduction - 1 x daily - 7 x weekly - 3 reps - 30 hold Shoulder External Rotation and Scapular Retraction with Resistance - 1 x daily - 7 x weekly - 2 sets - 12 reps Quadruped Scapular Protraction and Retraction - 1 x daily - 7 x weekly - 2 sets - 10 reps Quadruped Full Range Thoracic Rotation with Reach - 1 x daily - 7 x weekly - 2 sets - 5 reps - 5 hold

## 2021-04-05 DIAGNOSIS — M5459 Other low back pain: Secondary | ICD-10-CM | POA: Diagnosis not present

## 2021-04-05 DIAGNOSIS — M9904 Segmental and somatic dysfunction of sacral region: Secondary | ICD-10-CM | POA: Diagnosis not present

## 2021-04-05 DIAGNOSIS — M9903 Segmental and somatic dysfunction of lumbar region: Secondary | ICD-10-CM | POA: Diagnosis not present

## 2021-04-05 DIAGNOSIS — M7918 Myalgia, other site: Secondary | ICD-10-CM | POA: Diagnosis not present

## 2021-04-05 DIAGNOSIS — M9905 Segmental and somatic dysfunction of pelvic region: Secondary | ICD-10-CM | POA: Diagnosis not present

## 2021-04-07 ENCOUNTER — Ambulatory Visit: Payer: 59 | Admitting: Sports Medicine

## 2021-04-07 ENCOUNTER — Ambulatory Visit
Admission: RE | Admit: 2021-04-07 | Discharge: 2021-04-07 | Disposition: A | Discharge status: Home / Self Care | Payer: 59 | Source: Ambulatory Visit | Attending: Sports Medicine | Admitting: Sports Medicine

## 2021-04-07 VITALS — BP 165/95 | Ht 72.0 in | Wt 310.0 lb

## 2021-04-07 DIAGNOSIS — G8929 Other chronic pain: Secondary | ICD-10-CM | POA: Diagnosis not present

## 2021-04-07 DIAGNOSIS — M1711 Unilateral primary osteoarthritis, right knee: Secondary | ICD-10-CM | POA: Diagnosis not present

## 2021-04-07 DIAGNOSIS — M25561 Pain in right knee: Secondary | ICD-10-CM

## 2021-04-07 NOTE — Progress Notes (Signed)
   Subjective:    Patient ID: Kerry Nelson, male    DOB: 08-Feb-1966, 55 y.o.   MRN: [id number]  HPI chief complaint: Right knee pain  Kerry Nelson is a very pleasant 55 year old male that comes in today complaining of right knee pain and intermittent swelling.  He has had pain in this knee for several years.  He remembers having the knee aspirated and injected back in 2000.  Several years later he had to have another aspiration and injection.  His symptoms were fairly tolerable up until about 6 weeks ago when he slipped on a step injuring the knee.  Since then, he has had increasing pain with activity.  He enjoys being active participating in [employer] and running.  He notices popping and grinding as well as instability with these activities.  He is now beginning to get some pain with activities of daily living as well.  His pain is primarily in the anterior knee.  He is tried over-the-counter Tylenol and Motrin but they do not seem to be of much help.  He does have an upright knee brace which she has worn in the past but most recently caused his calf to get sore.  He denies any prior knee surgeries.  No recent imaging.  Past medical history reviewed Medications reviewed Allergies reviewed   Review of Systems As above    Objective:   Physical Exam  Well-developed, well-nourished.  No acute distress.  Awake alert and oriented x3.  Right knee: Range of motion is 0 to about 130 degrees.  Trace effusion.  Trace patellofemoral crepitus.  Knee is stable to valgus and varus stressing.  Negative anterior drawer, negative posterior drawer.  No tenderness to palpation along medial or lateral joint lines.  Negative McMurray's.  Negative Thessaly's.  Neurovascularly intact distally.  X-rays of the right knee including AP, lateral, and sunrise views shows moderately advanced medial compartmental DJD and patellofemoral DJD.  Nothing acute      Assessment & Plan:   Right knee pain and swelling secondary to  DJD  I discussed treatment options including cortisone injection, oral anti-inflammatories, home exercise programs, and custom unloading braces for running.  His pain is not bad enough today to consider cortisone injection but if that changes in the future he will return to the office for reconsideration of this.  He would also like to think about the unloader brace.  In the meantime, we will educate him on isometric quad exercises to start doing daily.  He may also eventually need to transition from impact exercises such as running and [employer] to nonimpact activities such as cycling, spinning, or swimming.  He would like to think about his options.  I think it may also be beneficial for him to return to the office sometime soon with his running shoes so that I may do a gait analysis to see if he may benefit from any sort of shoe insert.  This note was dictated using Dragon naturally speaking software and may contain errors in syntax, spelling, or content which have not been identified prior to signing this note.

## 2021-04-12 ENCOUNTER — Ambulatory Visit: Payer: 59 | Admitting: Sports Medicine

## 2021-04-19 ENCOUNTER — Ambulatory Visit: Payer: 59 | Admitting: Sports Medicine

## 2021-04-19 VITALS — BP 154/83 | Ht 72.0 in | Wt 310.0 lb

## 2021-04-19 DIAGNOSIS — M1711 Unilateral primary osteoarthritis, right knee: Secondary | ICD-10-CM | POA: Diagnosis not present

## 2021-04-19 MED ORDER — METHYLPREDNISOLONE ACETATE 40 MG/ML IJ SUSP
40.0000 mg | Freq: Once | INTRAMUSCULAR | Status: AC
  Administered 2021-04-19: 16:00:00 40 mg via INTRA_ARTICULAR

## 2021-04-19 NOTE — Progress Notes (Addendum)
° °  SUBJECTIVE:   CHIEF COMPLAINT / HPI:    Kerry Nelson is a 55 y.o. male here for right knee pain from 2 weeks ago. Pt here to discuss knee injection and brace. He has decreased his number of high impact workouts (running, Boot camp). He would like to stay more active.     PERTINENT  PMH / PSH: reviewed and updated as appropriate   OBJECTIVE:   BP (!) 154/83    Ht 6' (1.829 m)    Wt (!) 310 lb (140.6 kg)    BMI 42.04 kg/m    GEN: well appearing pleasant older male in no acute distress  CVS: well perfused  RESP: speaking in full sentences without pause, no respiratory distress  MSK: right knee  -Inspection: no deformity, no discoloration, no swelling -Palpation: no appreciable effusion, no joint line tenderness -ROM: normal extension, slightly decreased flexion compared to left  -Special Tests: Negative anterior drawer, negative posterior drawer.  There is pseudo instability with valgus stressing of the knee.  Knee is stable to varus stressing. -Limb neurovascularly intact, no instability noted -Gait assessment: bilateral pronation of feet with walking and running    PROCEDURE: JOINT INJECTION  After informed written consent timeout was performed, patient was seated in chair in exam room. Right knee was prepped with alcohol swab and utilizing anterolateral approach, patient's right knee was injected intraarticularly with 3 cc 1% lidocaine: 1 cc depomedrol.  Patient tolerated the procedure well without immediate complications.      ASSESSMENT/PLAN:    Right Knee Osteoarthritis  Previous knee x-ray reviewed and pt has tricompartmental degenerative changes. Pt tolerated knee injection today. Discussed medial unloading knee brace and pt is agreeable.  Follow up 3 weeks after wearing knee brace. Pt would like to remain active.  Provided with green insoles and scaphoid pads as he has bilateral pronation with walking and running.  Katha Cabal, DO PGY-3, [facility] 04/19/2021     Patient seen and evaluated with the resident.  I agree with the above plan of care.  Cortisone injection administered as above.  Given his medial compartmental DJD and pseudo instability on valgus stress testing, I think he may benefit from a medial unloader brace.  We will set that up with our [employer] representative.  We will also give him some green sports insoles and scaphoid pads for his shoes.  I would like to see him back in the office for follow-up approximately 3 weeks after he gets his brace.  He does enjoy running and [employer] but understands that he may be limited in what he can do with these given the degree of OA in his knee.

## 2021-04-19 NOTE — Patient Instructions (Signed)
Geri [place] [employer] Rep [phone]  She will contact you to set up a time to get measured for a medial unloader brace for your right knee.

## 2021-04-26 DIAGNOSIS — M109 Gout, unspecified: Secondary | ICD-10-CM | POA: Diagnosis not present

## 2021-04-26 DIAGNOSIS — G4730 Sleep apnea, unspecified: Secondary | ICD-10-CM | POA: Diagnosis not present

## 2021-04-26 DIAGNOSIS — E1169 Type 2 diabetes mellitus with other specified complication: Secondary | ICD-10-CM | POA: Diagnosis not present

## 2021-04-26 DIAGNOSIS — I10 Essential (primary) hypertension: Secondary | ICD-10-CM | POA: Diagnosis not present

## 2021-04-26 DIAGNOSIS — E7800 Pure hypercholesterolemia, unspecified: Secondary | ICD-10-CM | POA: Diagnosis not present

## 2021-04-26 DIAGNOSIS — Z125 Encounter for screening for malignant neoplasm of prostate: Secondary | ICD-10-CM | POA: Diagnosis not present

## 2021-04-26 DIAGNOSIS — Z23 Encounter for immunization: Secondary | ICD-10-CM | POA: Diagnosis not present

## 2021-04-26 DIAGNOSIS — Z0000 Encounter for general adult medical examination without abnormal findings: Secondary | ICD-10-CM | POA: Diagnosis not present

## 2021-04-26 DIAGNOSIS — K219 Gastro-esophageal reflux disease without esophagitis: Secondary | ICD-10-CM | POA: Diagnosis not present

## 2021-04-29 DIAGNOSIS — M1711 Unilateral primary osteoarthritis, right knee: Secondary | ICD-10-CM | POA: Diagnosis not present

## 2021-05-04 DIAGNOSIS — R1032 Left lower quadrant pain: Secondary | ICD-10-CM | POA: Diagnosis not present

## 2021-05-17 DIAGNOSIS — M9903 Segmental and somatic dysfunction of lumbar region: Secondary | ICD-10-CM | POA: Diagnosis not present

## 2021-05-17 DIAGNOSIS — M7918 Myalgia, other site: Secondary | ICD-10-CM | POA: Diagnosis not present

## 2021-05-17 DIAGNOSIS — M9905 Segmental and somatic dysfunction of pelvic region: Secondary | ICD-10-CM | POA: Diagnosis not present

## 2021-05-17 DIAGNOSIS — M5459 Other low back pain: Secondary | ICD-10-CM | POA: Diagnosis not present

## 2021-05-17 DIAGNOSIS — M9904 Segmental and somatic dysfunction of sacral region: Secondary | ICD-10-CM | POA: Diagnosis not present

## 2021-06-08 ENCOUNTER — Other Ambulatory Visit (HOSPITAL_COMMUNITY): Payer: Self-pay

## 2021-06-08 MED ORDER — ROSUVASTATIN CALCIUM 40 MG PO TABS
40.0000 mg | ORAL_TABLET | Freq: Every day | ORAL | 1 refills | Status: DC
  Filled 2021-06-08: qty 90, 90d supply, fill #0
  Filled 2022-02-10: qty 90, 90d supply, fill #1

## 2021-06-08 MED ORDER — EZETIMIBE 10 MG PO TABS
10.0000 mg | ORAL_TABLET | Freq: Every day | ORAL | 1 refills | Status: DC
  Filled 2021-06-08: qty 90, 90d supply, fill #0
  Filled 2022-02-10: qty 90, 90d supply, fill #1

## 2021-06-08 MED ORDER — JARDIANCE 25 MG PO TABS
25.0000 mg | ORAL_TABLET | Freq: Every day | ORAL | 1 refills | Status: DC
  Filled 2021-06-08: qty 90, 90d supply, fill #0
  Filled 2022-02-10: qty 90, 90d supply, fill #1

## 2021-06-09 ENCOUNTER — Other Ambulatory Visit (HOSPITAL_COMMUNITY): Payer: Self-pay

## 2021-06-09 MED ORDER — ESOMEPRAZOLE MAGNESIUM 40 MG PO CPDR
40.0000 mg | DELAYED_RELEASE_CAPSULE | Freq: Every day | ORAL | 0 refills | Status: DC | PRN
  Filled 2021-06-09: qty 90, 90d supply, fill #0

## 2021-06-21 DIAGNOSIS — M7918 Myalgia, other site: Secondary | ICD-10-CM | POA: Diagnosis not present

## 2021-06-21 DIAGNOSIS — M9905 Segmental and somatic dysfunction of pelvic region: Secondary | ICD-10-CM | POA: Diagnosis not present

## 2021-06-21 DIAGNOSIS — M5459 Other low back pain: Secondary | ICD-10-CM | POA: Diagnosis not present

## 2021-06-21 DIAGNOSIS — M9903 Segmental and somatic dysfunction of lumbar region: Secondary | ICD-10-CM | POA: Diagnosis not present

## 2021-06-21 DIAGNOSIS — M9904 Segmental and somatic dysfunction of sacral region: Secondary | ICD-10-CM | POA: Diagnosis not present

## 2021-09-21 ENCOUNTER — Other Ambulatory Visit (HOSPITAL_COMMUNITY): Payer: Self-pay

## 2021-09-21 MED ORDER — LOSARTAN POTASSIUM-HCTZ 100-25 MG PO TABS
1.0000 | ORAL_TABLET | Freq: Every day | ORAL | 0 refills | Status: DC
  Filled 2021-09-21: qty 90, 90d supply, fill #0

## 2021-09-22 ENCOUNTER — Other Ambulatory Visit (HOSPITAL_COMMUNITY): Payer: Self-pay

## 2021-09-22 MED ORDER — CHLORHEXIDINE GLUCONATE 0.12 % MT SOLN
OROMUCOSAL | 0 refills | Status: DC
  Filled 2021-09-22: qty 473, 30d supply, fill #0

## 2021-10-25 ENCOUNTER — Other Ambulatory Visit (HOSPITAL_COMMUNITY): Payer: Self-pay

## 2021-10-25 DIAGNOSIS — Z23 Encounter for immunization: Secondary | ICD-10-CM | POA: Diagnosis not present

## 2021-10-25 DIAGNOSIS — E7800 Pure hypercholesterolemia, unspecified: Secondary | ICD-10-CM | POA: Diagnosis not present

## 2021-10-25 DIAGNOSIS — E1169 Type 2 diabetes mellitus with other specified complication: Secondary | ICD-10-CM | POA: Diagnosis not present

## 2021-10-25 DIAGNOSIS — I10 Essential (primary) hypertension: Secondary | ICD-10-CM | POA: Diagnosis not present

## 2021-10-25 DIAGNOSIS — K219 Gastro-esophageal reflux disease without esophagitis: Secondary | ICD-10-CM | POA: Diagnosis not present

## 2021-10-25 DIAGNOSIS — M109 Gout, unspecified: Secondary | ICD-10-CM | POA: Diagnosis not present

## 2021-10-25 DIAGNOSIS — G4730 Sleep apnea, unspecified: Secondary | ICD-10-CM | POA: Diagnosis not present

## 2021-10-25 MED ORDER — TRULICITY 0.75 MG/0.5ML ~~LOC~~ SOAJ
0.7500 mg | SUBCUTANEOUS | 2 refills | Status: DC
  Filled 2021-10-25: qty 2, 28d supply, fill #0
  Filled 2021-11-30: qty 2, 28d supply, fill #1
  Filled 2021-12-27: qty 2, 28d supply, fill #2

## 2021-10-28 ENCOUNTER — Other Ambulatory Visit (HOSPITAL_COMMUNITY): Payer: Self-pay

## 2021-10-28 MED ORDER — DEXCOM G6 TRANSMITTER MISC
2 refills | Status: AC
  Filled 2021-10-28: qty 1, 1d supply, fill #0
  Filled 2021-11-14: qty 1, 90d supply, fill #0
  Filled 2022-01-08 – 2022-02-10 (×4): qty 1, 90d supply, fill #1

## 2021-10-28 MED ORDER — DEXCOM G6 RECEIVER DEVI
2 refills | Status: AC
  Filled 2021-10-28: qty 1, 1d supply, fill #0
  Filled 2021-12-13: qty 1, 30d supply, fill #0
  Filled 2022-05-10: qty 1, 30d supply, fill #1

## 2021-10-28 MED ORDER — DEXCOM G6 SENSOR MISC
2 refills | Status: DC
  Filled 2021-10-28 – 2021-11-14 (×2): qty 3, 30d supply, fill #0
  Filled 2021-12-13: qty 3, 30d supply, fill #1
  Filled 2022-01-08: qty 3, 30d supply, fill #2

## 2021-11-14 ENCOUNTER — Other Ambulatory Visit (HOSPITAL_COMMUNITY): Payer: Self-pay

## 2021-11-30 ENCOUNTER — Other Ambulatory Visit (HOSPITAL_COMMUNITY): Payer: Self-pay

## 2021-12-01 ENCOUNTER — Other Ambulatory Visit (HOSPITAL_COMMUNITY): Payer: Self-pay

## 2021-12-13 ENCOUNTER — Other Ambulatory Visit (HOSPITAL_COMMUNITY): Payer: Self-pay

## 2021-12-14 ENCOUNTER — Other Ambulatory Visit (HOSPITAL_COMMUNITY): Payer: Self-pay

## 2021-12-27 ENCOUNTER — Other Ambulatory Visit (HOSPITAL_COMMUNITY): Payer: Self-pay

## 2022-01-09 ENCOUNTER — Other Ambulatory Visit (HOSPITAL_COMMUNITY): Payer: Self-pay

## 2022-01-12 ENCOUNTER — Other Ambulatory Visit (HOSPITAL_COMMUNITY): Payer: Self-pay

## 2022-01-13 ENCOUNTER — Other Ambulatory Visit (HOSPITAL_COMMUNITY): Payer: Self-pay

## 2022-01-25 DIAGNOSIS — H40013 Open angle with borderline findings, low risk, bilateral: Secondary | ICD-10-CM | POA: Diagnosis not present

## 2022-01-25 DIAGNOSIS — E1169 Type 2 diabetes mellitus with other specified complication: Secondary | ICD-10-CM | POA: Diagnosis not present

## 2022-01-25 DIAGNOSIS — E119 Type 2 diabetes mellitus without complications: Secondary | ICD-10-CM | POA: Diagnosis not present

## 2022-01-25 DIAGNOSIS — H524 Presbyopia: Secondary | ICD-10-CM | POA: Diagnosis not present

## 2022-01-25 DIAGNOSIS — E7800 Pure hypercholesterolemia, unspecified: Secondary | ICD-10-CM | POA: Diagnosis not present

## 2022-01-26 ENCOUNTER — Telehealth: Payer: 59 | Admitting: Physician Assistant

## 2022-01-26 ENCOUNTER — Other Ambulatory Visit (HOSPITAL_COMMUNITY): Payer: Self-pay

## 2022-01-26 DIAGNOSIS — J208 Acute bronchitis due to other specified organisms: Secondary | ICD-10-CM | POA: Diagnosis not present

## 2022-01-26 DIAGNOSIS — B9689 Other specified bacterial agents as the cause of diseases classified elsewhere: Secondary | ICD-10-CM

## 2022-01-26 MED ORDER — AZITHROMYCIN 250 MG PO TABS
ORAL_TABLET | ORAL | 0 refills | Status: AC
  Filled 2022-01-26: qty 6, 5d supply, fill #0

## 2022-01-26 MED ORDER — BENZONATATE 100 MG PO CAPS
100.0000 mg | ORAL_CAPSULE | Freq: Three times a day (TID) | ORAL | 0 refills | Status: DC | PRN
  Filled 2022-01-26: qty 30, 10d supply, fill #0

## 2022-01-26 MED ORDER — ALBUTEROL SULFATE HFA 108 (90 BASE) MCG/ACT IN AERS
2.0000 | INHALATION_SPRAY | Freq: Four times a day (QID) | RESPIRATORY_TRACT | 0 refills | Status: DC | PRN
  Filled 2022-01-26: qty 6.7, 25d supply, fill #0

## 2022-01-26 NOTE — Progress Notes (Signed)
Virtual Visit Consent   Kerry Nelson, you are scheduled for a virtual visit with a [facility] provider today. Just as with appointments in the office, your consent must be obtained to participate. Your consent will be active for this visit and any virtual visit you may have with one of our providers in the next 365 days. If you have a MyChart account, a copy of this consent can be sent to you electronically.  As this is a virtual visit, video technology does not allow for your provider to perform a traditional examination. This may limit your provider's ability to fully assess your condition. If your provider identifies any concerns that need to be evaluated in person or the need to arrange testing (such as labs, EKG, etc.), we will make arrangements to do so. Although advances in technology are sophisticated, we cannot ensure that it will always work on either your end or our end. If the connection with a video visit is poor, the visit may have to be switched to a telephone visit. With either a video or telephone visit, we are not always able to ensure that we have a secure connection.  By engaging in this virtual visit, you consent to the provision of healthcare and authorize for your insurance to be billed (if applicable) for the services provided during this visit. Depending on your insurance coverage, you may receive a charge related to this service.  I need to obtain your verbal consent now. Are you willing to proceed with your visit today? Kerry Nelson has provided verbal consent on 01/26/2022 for a virtual visit (video or telephone). Kerry Nelson, [place]  Date: 01/26/2022 9:29 AM  Virtual Visit via Video Note   I, Kerry Nelson, connected with  [name]  ([id number], 01/28/1966) on 01/26/22 at  9:30 AM EDT by a video-enabled telemedicine application and verified that I am speaking with the correct person using two identifiers.  Location: Patient: Virtual Visit Location  Patient: Home Provider: Virtual Visit Location Provider: Home Office   I discussed the limitations of evaluation and management by telemedicine and the availability of in person appointments. The patient expressed understanding and agreed to proceed.    History of Present Illness: Kerry Nelson is a 56 y.o. who identifies as a male who was assigned male at birth, and is being seen today for URI symptoms over past 2+ weeks. Initially starting with scratchy throat, PND and dry cough. ST and most of PND has resolved. Cough has now become productive with thick colored phlegm and increase in frequency of coughing, with some chest congestion. Denies fever, chills. Denies CP or SOB.   HPI: HPI  Problems:  Patient Active Problem List   Diagnosis Date Noted   Left hip pain 06/19/2016   ELBOW PAIN, LEFT 08/05/2009   SHOULDER IMPINGEMENT SYNDROME 08/05/2009   LATERAL EPICONDYLITIS 08/05/2009   ANKLE PAIN, LEFT 02/10/2009   OTHER ACQUIRED DEFORMITY OF ANKLE AND FOOT OTHER 02/10/2009   Osteoarthritis of right knee 03/10/2008   KNEE PAIN 03/10/2008   UNEQUAL LEG LENGTH 03/10/2008    Allergies:  Allergies  Allergen Reactions   Aleve [Naproxen] Palpitations   Medications:  Current Outpatient Medications:    albuterol (VENTOLIN HFA) 108 (90 Base) MCG/ACT inhaler, Inhale 2 puffs into the lungs every 6 (six) hours as needed for wheezing or shortness of breath., Disp: 8 g, Rfl: 0   azithromycin (ZITHROMAX) 250 MG tablet, Take 2 tablets on day 1, then 1  tablet daily on days 2 through 5, Disp: 6 tablet, Rfl: 0   benzonatate (TESSALON) 100 MG capsule, Take 1 capsule (100 mg total) by mouth 3 (three) times daily as needed for cough., Disp: 30 capsule, Rfl: 0   chlorhexidine (PERIOGARD) 0.12 % solution, Swish and spit 1 capful once daily, do not swallow, Disp: 473 mL, Rfl: 0   Continuous Blood Gluc Receiver (DEXCOM G6 RECEIVER) DEVI, Use as directed, Disp: 1 each, Rfl: 2   Continuous Blood Gluc Sensor  (DEXCOM G6 SENSOR) MISC, Change sensor every 10 days as directed, Disp: 3 each, Rfl: 2   Continuous Blood Gluc Transmit (DEXCOM G6 TRANSMITTER) MISC, Use as directed, Disp: 1 each, Rfl: 2   cyclobenzaprine (FLEXERIL) 10 MG tablet, Take 1 tablet (10 mg total) by mouth at bedtime as needed., Disp: 30 tablet, Rfl: 0   Dulaglutide (TRULICITY) 0.75 MG/0.5ML SOPN, Inject 0.75 mg into the skin once a week., Disp: 2 mL, Rfl: 2   empagliflozin (JARDIANCE) 25 MG TABS tablet, Take 1 tablet (25 mg total) by mouth daily., Disp: 90 tablet, Rfl: 1   esomeprazole (NEXIUM) 40 MG capsule, Take 40 mg by mouth daily before breakfast., Disp: , Rfl:    esomeprazole (NEXIUM) 40 MG capsule, Take 1 capsule (40 mg total) by mouth daily as needed., Disp: 90 capsule, Rfl: 0   etodolac (LODINE) 400 MG tablet, Take 1 tablet (400 mg total) by mouth 2 (two) times daily as needed for pain, Disp: 60 tablet, Rfl: 0   ezetimibe (ZETIA) 10 MG tablet, Take 10 mg by mouth daily., Disp: , Rfl:    ezetimibe (ZETIA) 10 MG tablet, TAKE 1 TABLET BY MOUTH ONCE DAILY., Disp: 90 tablet, Rfl: 0   ezetimibe (ZETIA) 10 MG tablet, Take 1 tablet (10 mg total) by mouth daily., Disp: 90 tablet, Rfl: 1   losartan-hydrochlorothiazide (HYZAAR) 100-25 MG per tablet, Take 1 tablet by mouth daily., Disp: , Rfl:    losartan-hydrochlorothiazide (HYZAAR) 100-25 MG tablet, TAKE 1 TABLET BY MOUTH ONCE DAILY., Disp: 90 tablet, Rfl: 0   losartan-hydrochlorothiazide (HYZAAR) 100-25 MG tablet, Take 1 tablet by mouth daily., Disp: 90 tablet, Rfl: 0   metFORMIN (GLUCOPHAGE) 1000 MG tablet, TAKE 1 TABLET BY MOUTH 2 TIMES A DAY, Disp: 180 tablet, Rfl: 0   metFORMIN (GLUCOPHAGE) 500 MG tablet, Take 500 mg by mouth daily with breakfast. , Disp: , Rfl:    rosuvastatin (CRESTOR) 40 MG tablet, Take 40 mg by mouth daily., Disp: , Rfl:    rosuvastatin (CRESTOR) 40 MG tablet, TAKE 1 TABLET BY MOUTH ONCE DAILY., Disp: 90 tablet, Rfl: 0   rosuvastatin (CRESTOR) 40 MG tablet, Take  1 tablet (40 mg total) by mouth daily., Disp: 90 tablet, Rfl: 1  Observations/Objective: Patient is well-developed, well-nourished in no acute distress.  Resting comfortably at work. Head is normocephalic, atraumatic.  No labored breathing. Speech is clear and coherent with logical content.  Patient is alert and oriented at baseline.   Assessment and Plan: 1. Acute bacterial bronchitis - benzonatate (TESSALON) 100 MG capsule; Take 1 capsule (100 mg total) by mouth 3 (three) times daily as needed for cough.  Dispense: 30 capsule; Refill: 0 - albuterol (VENTOLIN HFA) 108 (90 Base) MCG/ACT inhaler; Inhale 2 puffs into the lungs every 6 (six) hours as needed for wheezing or shortness of breath.  Dispense: 8 g; Refill: 0 - azithromycin (ZITHROMAX) 250 MG tablet; Take 2 tablets on day 1, then 1 tablet daily on days 2 through 5  Dispense: 6 tablet; Refill: 0  Rx Azithromycin.  Increase fluids.  Rest.  Saline nasal spray.  Probiotic.  Mucinex as directed.  Humidifier in bedroom. Tessalon and Albuterol per orders.  Call or return to clinic if symptoms are not improving.   Follow Up Instructions: I discussed the assessment and treatment plan with the patient. The patient was provided an opportunity to ask questions and all were answered. The patient agreed with the plan and demonstrated an understanding of the instructions.  A copy of instructions were sent to the patient via MyChart unless otherwise noted below.   The patient was advised to call back or seek an in-person evaluation if the symptoms worsen or if the condition fails to improve as anticipated.  Time:  I spent 8 minutes with the patient via telehealth technology discussing the above problems/concerns.    Leeanne Rio, PA-C

## 2022-01-26 NOTE — Patient Instructions (Signed)
Kerry Nelson, thank you for joining Leeanne Rio, PA-C for today's virtual visit.  While this provider is not your primary care provider (PCP), if your PCP is located in our provider database this encounter information will be shared with them immediately following your visit.  Consent: (Patient) Kerry Nelson provided verbal consent for this virtual visit at the beginning of the encounter.  Current Medications:  Current Outpatient Medications:    chlorhexidine (PERIOGARD) 0.12 % solution, Swish and spit 1 capful once daily, do not swallow, Disp: 473 mL, Rfl: 0   Continuous Blood Gluc Receiver (DEXCOM G6 RECEIVER) DEVI, Use as directed, Disp: 1 each, Rfl: 2   Continuous Blood Gluc Sensor (DEXCOM G6 SENSOR) MISC, Change sensor every 10 days as directed, Disp: 3 each, Rfl: 2   Continuous Blood Gluc Transmit (DEXCOM G6 TRANSMITTER) MISC, Use as directed, Disp: 1 each, Rfl: 2   cyclobenzaprine (FLEXERIL) 10 MG tablet, Take 1 tablet (10 mg total) by mouth at bedtime as needed., Disp: 30 tablet, Rfl: 0   Dulaglutide (TRULICITY) 9.89 QJ/1.9ER SOPN, Inject 0.75 mg into the skin once a week., Disp: 2 mL, Rfl: 2   empagliflozin (JARDIANCE) 25 MG TABS tablet, Take 1 tablet (25 mg total) by mouth daily., Disp: 90 tablet, Rfl: 1   esomeprazole (NEXIUM) 40 MG capsule, Take 40 mg by mouth daily before breakfast., Disp: , Rfl:    esomeprazole (NEXIUM) 40 MG capsule, Take 1 capsule (40 mg total) by mouth daily as needed., Disp: 90 capsule, Rfl: 0   etodolac (LODINE) 400 MG tablet, Take 1 tablet (400 mg total) by mouth 2 (two) times daily as needed for pain, Disp: 60 tablet, Rfl: 0   ezetimibe (ZETIA) 10 MG tablet, Take 10 mg by mouth daily., Disp: , Rfl:    ezetimibe (ZETIA) 10 MG tablet, TAKE 1 TABLET BY MOUTH ONCE DAILY., Disp: 90 tablet, Rfl: 0   ezetimibe (ZETIA) 10 MG tablet, Take 1 tablet (10 mg total) by mouth daily., Disp: 90 tablet, Rfl: 1   losartan-hydrochlorothiazide (HYZAAR) 100-25 MG per  tablet, Take 1 tablet by mouth daily., Disp: , Rfl:    losartan-hydrochlorothiazide (HYZAAR) 100-25 MG tablet, TAKE 1 TABLET BY MOUTH ONCE DAILY., Disp: 90 tablet, Rfl: 0   losartan-hydrochlorothiazide (HYZAAR) 100-25 MG tablet, Take 1 tablet by mouth daily., Disp: 90 tablet, Rfl: 0   metFORMIN (GLUCOPHAGE) 1000 MG tablet, TAKE 1 TABLET BY MOUTH 2 TIMES A DAY, Disp: 180 tablet, Rfl: 0   metFORMIN (GLUCOPHAGE) 500 MG tablet, Take 500 mg by mouth daily with breakfast. , Disp: , Rfl:    rosuvastatin (CRESTOR) 40 MG tablet, Take 40 mg by mouth daily., Disp: , Rfl:    rosuvastatin (CRESTOR) 40 MG tablet, TAKE 1 TABLET BY MOUTH ONCE DAILY., Disp: 90 tablet, Rfl: 0   rosuvastatin (CRESTOR) 40 MG tablet, Take 1 tablet (40 mg total) by mouth daily., Disp: 90 tablet, Rfl: 1   Medications ordered in this encounter:  No orders of the defined types were placed in this encounter.    *If you need refills on other medications prior to your next appointment, please contact your pharmacy*  Follow-Up: Call back or seek an in-person evaluation if the symptoms worsen or if the condition fails to improve as anticipated.  [place] [phone]  Other Instructions Take antibiotic (Azithromycin) as directed.  Increase fluids.  Get plenty of rest. Use Mucinex for congestion. Use the Tessalon for cough. The albuterol is to be used  as directed, when needed, for wheezing and tightness. Take a daily probiotic (I recommend Align or Culturelle, but even Activia Yogurt may be beneficial).  A humidifier placed in the bedroom may offer some relief for a dry, scratchy throat of nasal irritation.  Read information below on acute bronchitis. Please call or return to clinic if symptoms are not improving.  Acute Bronchitis Bronchitis is when the airways that extend from the windpipe into the lungs get red, puffy, and painful (inflamed). Bronchitis often causes thick spit (mucus) to develop. This leads to a cough.  A cough is the most common symptom of bronchitis. In acute bronchitis, the condition usually begins suddenly and goes away over time (usually in 2 weeks). Smoking, allergies, and asthma can make bronchitis worse. Repeated episodes of bronchitis may cause more lung problems.  HOME CARE Rest. Drink enough fluids to keep your pee (urine) clear or pale yellow (unless you need to limit fluids as told by your doctor). Only take over-the-counter or prescription medicines as told by your doctor. Avoid smoking and secondhand smoke. These can make bronchitis worse. If you are a smoker, think about using nicotine gum or skin patches. Quitting smoking will help your lungs heal faster. Reduce the chance of getting bronchitis again by: Washing your hands often. Avoiding people with cold symptoms. Trying not to touch your hands to your mouth, nose, or eyes. Follow up with your doctor as told.  GET HELP IF: Your symptoms do not improve after 1 week of treatment. Symptoms include: Cough. Fever. Coughing up thick spit. Body aches. Chest congestion. Chills. Shortness of breath. Sore throat.  GET HELP RIGHT AWAY IF:  You have an increased fever. You have chills. You have severe shortness of breath. You have bloody thick spit (sputum). You throw up (vomit) often. You lose too much body fluid (dehydration). You have a severe headache. You faint.  MAKE SURE YOU:  Understand these instructions. Will watch your condition. Will get help right away if you are not doing well or get worse. Document Released: 10/04/2007 Document Revised: 12/18/2012 Document Reviewed: 10/08/2012 [facility] Patient Information 2015 [place], [place]. This information is not intended to replace advice given to you by your health care provider. Make sure you discuss any questions you have with your health care provider.    If you have been instructed to have an in-person evaluation today at a local Urgent Care facility, please  use the link below. It will take you to a list of all of our available [facility], including address, phone number and hours of operation. Please do not delay care.  [facility]  If you or a family member do not have a primary care provider, use the link below to schedule a visit and establish care. When you choose a [facility] primary care physician or advanced practice provider, you gain a long-term partner in health. Find a Primary Care Provider  Learn more about [facility]'s in-office and virtual care options: [facility] - Get Care Now

## 2022-02-06 ENCOUNTER — Other Ambulatory Visit (HOSPITAL_COMMUNITY): Payer: Self-pay

## 2022-02-06 MED ORDER — DEXCOM G6 SENSOR MISC
2 refills | Status: DC
  Filled 2022-02-10: qty 3, 30d supply, fill #0
  Filled 2022-03-08: qty 3, 30d supply, fill #1
  Filled 2022-04-07: qty 3, 30d supply, fill #2

## 2022-02-09 ENCOUNTER — Other Ambulatory Visit (HOSPITAL_COMMUNITY): Payer: Self-pay

## 2022-02-10 ENCOUNTER — Other Ambulatory Visit (HOSPITAL_COMMUNITY): Payer: Self-pay

## 2022-02-10 MED ORDER — LOSARTAN POTASSIUM-HCTZ 100-25 MG PO TABS
1.0000 | ORAL_TABLET | Freq: Every day | ORAL | 0 refills | Status: DC
  Filled 2022-02-10: qty 90, 90d supply, fill #0

## 2022-02-10 MED ORDER — ESOMEPRAZOLE MAGNESIUM 40 MG PO CPDR
40.0000 mg | DELAYED_RELEASE_CAPSULE | Freq: Every day | ORAL | 0 refills | Status: DC | PRN
  Filled 2022-02-10: qty 90, 90d supply, fill #0

## 2022-02-10 MED ORDER — METFORMIN HCL 1000 MG PO TABS
1000.0000 mg | ORAL_TABLET | Freq: Two times a day (BID) | ORAL | 1 refills | Status: DC
  Filled 2022-02-10: qty 180, 90d supply, fill #0
  Filled 2023-01-15: qty 180, 90d supply, fill #1

## 2022-02-10 MED ORDER — TRULICITY 0.75 MG/0.5ML ~~LOC~~ SOAJ
0.7500 mg | SUBCUTANEOUS | 2 refills | Status: DC
  Filled 2022-02-10: qty 2, 28d supply, fill #0
  Filled 2022-03-08: qty 2, 28d supply, fill #1
  Filled 2022-04-07: qty 2, 28d supply, fill #2

## 2022-02-14 ENCOUNTER — Other Ambulatory Visit (HOSPITAL_COMMUNITY): Payer: Self-pay

## 2022-02-23 ENCOUNTER — Other Ambulatory Visit (HOSPITAL_COMMUNITY): Payer: Self-pay

## 2022-02-23 ENCOUNTER — Telehealth: Payer: 59 | Admitting: Physician Assistant

## 2022-02-23 DIAGNOSIS — J208 Acute bronchitis due to other specified organisms: Secondary | ICD-10-CM | POA: Diagnosis not present

## 2022-02-23 DIAGNOSIS — B9689 Other specified bacterial agents as the cause of diseases classified elsewhere: Secondary | ICD-10-CM | POA: Diagnosis not present

## 2022-02-23 MED ORDER — DOXYCYCLINE HYCLATE 100 MG PO TABS
100.0000 mg | ORAL_TABLET | Freq: Two times a day (BID) | ORAL | 0 refills | Status: DC
  Filled 2022-02-23: qty 20, 10d supply, fill #0

## 2022-02-23 MED ORDER — PREDNISONE 20 MG PO TABS
20.0000 mg | ORAL_TABLET | Freq: Every day | ORAL | 0 refills | Status: DC
  Filled 2022-02-23: qty 7, 7d supply, fill #0

## 2022-02-23 MED ORDER — BENZONATATE 100 MG PO CAPS
100.0000 mg | ORAL_CAPSULE | Freq: Three times a day (TID) | ORAL | 0 refills | Status: DC | PRN
  Filled 2022-02-23: qty 30, 10d supply, fill #0

## 2022-02-23 NOTE — Patient Instructions (Signed)
Kerry Nelson, thank you for joining Mar Daring, PA-C for today's virtual visit.  While this provider is not your primary care provider (PCP), if your PCP is located in our provider database this encounter information will be shared with them immediately following your visit.   [facility] account gives you access to today's visit and all your visits, tests, and labs performed at [facility] " click here if you don't have a [facility] account or go to [email or web address]  Consent: (Patient) Kerry Nelson provided verbal consent for this virtual visit at the beginning of the encounter.  Current Medications:  Current Outpatient Medications:    benzonatate (TESSALON) 100 MG capsule, Take 1 capsule (100 mg total) by mouth 3 (three) times daily as needed., Disp: 30 capsule, Rfl: 0   doxycycline (VIBRA-TABS) 100 MG tablet, Take 1 tablet (100 mg total) by mouth 2 (two) times daily., Disp: 20 tablet, Rfl: 0   predniSONE (DELTASONE) 20 MG tablet, Take 1 tablet (20 mg total) by mouth daily with breakfast., Disp: 7 tablet, Rfl: 0   albuterol (VENTOLIN HFA) 108 (90 Base) MCG/ACT inhaler, Inhale 2 puffs into the lungs every 6 (six) hours as needed for wheezing or shortness of breath., Disp: 6.7 g, Rfl: 0   chlorhexidine (PERIOGARD) 0.12 % solution, Swish and spit 1 capful once daily, do not swallow, Disp: 473 mL, Rfl: 0   Continuous Blood Gluc Receiver (DEXCOM G6 RECEIVER) DEVI, Use as directed, Disp: 1 each, Rfl: 2   Continuous Blood Gluc Sensor (DEXCOM G6 SENSOR) MISC, Use as directed change every 10 days, Disp: 3 each, Rfl: 2   Continuous Blood Gluc Transmit (DEXCOM G6 TRANSMITTER) MISC, Use as directed, Disp: 1 each, Rfl: 2   cyclobenzaprine (FLEXERIL) 10 MG tablet, Take 1 tablet (10 mg total) by mouth at bedtime as needed., Disp: 30 tablet, Rfl: 0   Dulaglutide (TRULICITY) [id number] SOPN, Inject 0.75 mg into the skin once a week., Disp: 2 mL, Rfl: 2    empagliflozin (JARDIANCE) 25 MG TABS tablet, Take 1 tablet (25 mg total) by mouth daily., Disp: 90 tablet, Rfl: 1   esomeprazole (NEXIUM) 40 MG capsule, Take 40 mg by mouth daily before breakfast., Disp: , Rfl:    esomeprazole (NEXIUM) 40 MG capsule, Take 1 capsule (40 mg total) by mouth daily as needed., Disp: 90 capsule, Rfl: 0   etodolac (LODINE) 400 MG tablet, Take 1 tablet (400 mg total) by mouth 2 (two) times daily as needed for pain, Disp: 60 tablet, Rfl: 0   ezetimibe (ZETIA) 10 MG tablet, Take 10 mg by mouth daily., Disp: , Rfl:    ezetimibe (ZETIA) 10 MG tablet, TAKE 1 TABLET BY MOUTH ONCE DAILY., Disp: 90 tablet, Rfl: 0   ezetimibe (ZETIA) 10 MG tablet, Take 1 tablet (10 mg total) by mouth daily., Disp: 90 tablet, Rfl: 1   losartan-hydrochlorothiazide (HYZAAR) 100-25 MG per tablet, Take 1 tablet by mouth daily., Disp: , Rfl:    losartan-hydrochlorothiazide (HYZAAR) 100-25 MG tablet, TAKE 1 TABLET BY MOUTH ONCE DAILY., Disp: 90 tablet, Rfl: 0   losartan-hydrochlorothiazide (HYZAAR) 100-25 MG tablet, Take 1 tablet by mouth daily., Disp: 90 tablet, Rfl: 0   metFORMIN (GLUCOPHAGE) 1000 MG tablet, TAKE 1 TABLET BY MOUTH 2 TIMES A DAY, Disp: 180 tablet, Rfl: 0   metFORMIN (GLUCOPHAGE) 1000 MG tablet, Take 1 tablet (1,000 mg total) by mouth 2 (two) times daily., Disp: 180 tablet, Rfl: 1   metFORMIN (GLUCOPHAGE) 500  MG tablet, Take 500 mg by mouth daily with breakfast. , Disp: , Rfl:    rosuvastatin (CRESTOR) 40 MG tablet, Take 40 mg by mouth daily., Disp: , Rfl:    rosuvastatin (CRESTOR) 40 MG tablet, TAKE 1 TABLET BY MOUTH ONCE DAILY., Disp: 90 tablet, Rfl: 0   rosuvastatin (CRESTOR) 40 MG tablet, Take 1 tablet (40 mg total) by mouth daily., Disp: 90 tablet, Rfl: 1   Medications ordered in this encounter:  Meds ordered this encounter  Medications   predniSONE (DELTASONE) 20 MG tablet    Sig: Take 1 tablet (20 mg total) by mouth daily with breakfast.    Dispense:  7 tablet    Refill:  0     Order Specific Question:   Supervising Provider    Answer:   Chase Picket [[id number]]   doxycycline (VIBRA-TABS) 100 MG tablet    Sig: Take 1 tablet (100 mg total) by mouth 2 (two) times daily.    Dispense:  20 tablet    Refill:  0    Order Specific Question:   Supervising Provider    Answer:   Chase Picket [[id number]]   benzonatate (TESSALON) 100 MG capsule    Sig: Take 1 capsule (100 mg total) by mouth 3 (three) times daily as needed.    Dispense:  30 capsule    Refill:  0    Order Specific Question:   Supervising Provider    Answer:   Chase Picket [id number]     *If you need refills on other medications prior to your next appointment, please contact your pharmacy*  Follow-Up: Call back or seek an in-person evaluation if the symptoms worsen or if the condition fails to improve as anticipated.  [place] [phone]  Other Instructions  Acute Bronchitis, Adult  Acute bronchitis is sudden inflammation of the main airways (bronchi) that come off the windpipe (trachea) in the lungs. The swelling causes the airways to get smaller and make more mucus than normal. This can make it hard to breathe and can cause coughing or noisy breathing (wheezing). Acute bronchitis may last several weeks. The cough may last longer. Allergies, asthma, and exposure to smoke may make the condition worse. What are the causes? This condition can be caused by germs and by substances that irritate the lungs, including: Cold and flu viruses. The most common cause of this condition is the virus that causes the common cold. Bacteria. This is less common. Breathing in substances that irritate the lungs, including: Smoke from cigarettes and other forms of tobacco. Dust and pollen. Fumes from household cleaning products, gases, or burned fuel. Indoor or outdoor air pollution. What increases the risk? The following factors may make you more likely to develop this condition: A weak  body's defense system, also called the immune system. A condition that affects your lungs and breathing, such as asthma. What are the signs or symptoms? Common symptoms of this condition include: Coughing. This may bring up clear, yellow, or green mucus from your lungs (sputum). Wheezing. Runny or stuffy nose. Having too much mucus in your lungs (chest congestion). Shortness of breath. Aches and pains, including sore throat or chest. How is this diagnosed? This condition is usually diagnosed based on: Your symptoms and medical history. A physical exam. You may also have other tests, including tests to rule out other conditions, such as pneumonia. These tests include: A test of lung function. Test of a mucus sample to look for the  presence of bacteria. Tests to check the oxygen level in your blood. Blood tests. Chest X-ray. How is this treated? Most cases of acute bronchitis clear up over time without treatment. Your health care provider may recommend: Drinking more fluids to help thin your mucus so it is easier to cough up. Taking inhaled medicine (inhaler) to improve air flow in and out of your lungs. Using a vaporizer or a humidifier. These are machines that add water to the air to help you breathe better. Taking a medicine that thins mucus and clears congestion (expectorant). Taking a medicine that prevents or stops coughing (cough suppressant). It is not common to take an antibiotic medicine for this condition. Follow these instructions at home:  Take over-the-counter and prescription medicines only as told by your health care provider. Use an inhaler, vaporizer, or humidifier as told by your health care provider. Take two teaspoons (10 mL) of honey at bedtime to lessen coughing at night. Drink enough fluid to keep your urine pale yellow. Do not use any products that contain nicotine or tobacco. These products include cigarettes, chewing tobacco, and vaping devices, such as  e-cigarettes. If you need help quitting, ask your health care provider. Get plenty of rest. Return to your normal activities as told by your health care provider. Ask your health care provider what activities are safe for you. Keep all follow-up visits. This is important. How is this prevented? To lower your risk of getting this condition again: Wash your hands often with soap and water for at least 20 seconds. If soap and water are not available, use hand sanitizer. Avoid contact with people who have cold symptoms. Try not to touch your mouth, nose, or eyes with your hands. Avoid breathing in smoke or chemical fumes. Breathing smoke or chemical fumes will make your condition worse. Get the flu shot every year. Contact a health care provider if: Your symptoms do not improve after 2 weeks. You have trouble coughing up the mucus. Your cough keeps you awake at night. You have a fever. Get help right away if you: Cough up blood. Feel pain in your chest. Have severe shortness of breath. Faint or keep feeling like you are going to faint. Have a severe headache. Have a fever or chills that get worse. These symptoms may represent a serious problem that is an emergency. Do not wait to see if the symptoms will go away. Get medical help right away. Call your local emergency services (911 in the [place] Do not drive yourself to the hospital. Summary Acute bronchitis is inflammation of the main airways (bronchi) that come off the windpipe (trachea) in the lungs. The swelling causes the airways to get smaller and make more mucus than normal. Drinking more fluids can help thin your mucus so it is easier to cough up. Take over-the-counter and prescription medicines only as told by your health care provider. Do not use any products that contain nicotine or tobacco. These products include cigarettes, chewing tobacco, and vaping devices, such as e-cigarettes. If you need help quitting, ask your health care  provider. Contact a health care provider if your symptoms do not improve after 2 weeks. This information is not intended to replace advice given to you by your health care provider. Make sure you discuss any questions you have with your health care provider. Document Revised: 07/28/2021 Document Reviewed: 08/18/2020 Elsevier Patient Education  2023 [employer]    If you have been instructed to have an in-person evaluation today at  a local Urgent Care facility, please use the link below. It will take you to a list of all of our available [facility], including address, phone number and hours of operation. Please do not delay care.  [facility]  If you or a family member do not have a primary care provider, use the link below to schedule a visit and establish care. When you choose a [facility] primary care physician or advanced practice provider, you gain a long-term partner in health. Find a Primary Care Provider  Learn more about [facility]'s in-office and virtual care options: [facility]

## 2022-02-23 NOTE — Progress Notes (Signed)
Virtual Visit Consent   Kerry Nelson, you are scheduled for a virtual visit with a [facility] provider today. Just as with appointments in the office, your consent must be obtained to participate. Your consent will be active for this visit and any virtual visit you may have with one of our providers in the next 365 days. If you have a MyChart account, a copy of this consent can be sent to you electronically.  As this is a virtual visit, video technology does not allow for your provider to perform a traditional examination. This may limit your provider's ability to fully assess your condition. If your provider identifies any concerns that need to be evaluated in person or the need to arrange testing (such as labs, EKG, etc.), we will make arrangements to do so. Although advances in technology are sophisticated, we cannot ensure that it will always work on either your end or our end. If the connection with a video visit is poor, the visit may have to be switched to a telephone visit. With either a video or telephone visit, we are not always able to ensure that we have a secure connection.  By engaging in this virtual visit, you consent to the provision of healthcare and authorize for your insurance to be billed (if applicable) for the services provided during this visit. Depending on your insurance coverage, you may receive a charge related to this service.  I need to obtain your verbal consent now. Are you willing to proceed with your visit today? Kerry Nelson has provided verbal consent on 02/23/2022 for a virtual visit (video or telephone). Margaretann Loveless, PA-C  Date: 02/23/2022 9:39 AM  Virtual Visit via Video Note   I, Margaretann Loveless, connected with  Kerry Nelson  ([id number], 1965-11-06) on 02/23/22 at  9:30 AM EDT by a video-enabled telemedicine application and verified that I am speaking with the correct person using two identifiers.  Location: Patient: Virtual Visit Location  Patient: Mobile Provider: Virtual Visit Location Provider: Home Office   I discussed the limitations of evaluation and management by telemedicine and the availability of in person appointments. The patient expressed understanding and agreed to proceed.    History of Present Illness: Kerry Nelson is a 56 y.o. who identifies as a male who was assigned male at birth, and is being seen today for continued cough.  HPI: Cough This is a recurrent problem. The current episode started 1 to 4 weeks ago (was seen virtually on 01/26/22 and treated with Z-pack and tessalon perles). The problem has been gradually worsening (improved slightly after treatment but no complete resolution of symptoms). The problem occurs hourly. The cough is Productive of sputum and productive of purulent sputum. Associated symptoms include myalgias (from coughin), postnasal drip and a sore throat (from coughing). Pertinent negatives include no chest pain, chills, fever, headaches, nasal congestion, rhinorrhea, shortness of breath or wheezing. The symptoms are aggravated by lying down. He has tried OTC cough suppressant and prescription cough suppressant (antibiotics) for the symptoms. The treatment provided mild relief. His past medical history is significant for bronchitis. There is no history of asthma.      Problems:  Patient Active Problem List   Diagnosis Date Noted   Left hip pain 06/19/2016   ELBOW PAIN, LEFT 08/05/2009   SHOULDER IMPINGEMENT SYNDROME 08/05/2009   LATERAL EPICONDYLITIS 08/05/2009   ANKLE PAIN, LEFT 02/10/2009   OTHER ACQUIRED DEFORMITY OF ANKLE AND FOOT OTHER 02/10/2009   Osteoarthritis  of right knee 03/10/2008   KNEE PAIN 03/10/2008   UNEQUAL LEG LENGTH 03/10/2008    Allergies:  Allergies  Allergen Reactions   Aleve [Naproxen] Palpitations   Medications:  Current Outpatient Medications:    benzonatate (TESSALON) 100 MG capsule, Take 1 capsule (100 mg total) by mouth 3 (three) times daily as  needed., Disp: 30 capsule, Rfl: 0   doxycycline (VIBRA-TABS) 100 MG tablet, Take 1 tablet (100 mg total) by mouth 2 (two) times daily., Disp: 20 tablet, Rfl: 0   predniSONE (DELTASONE) 20 MG tablet, Take 1 tablet (20 mg total) by mouth daily with breakfast., Disp: 7 tablet, Rfl: 0   albuterol (VENTOLIN HFA) 108 (90 Base) MCG/ACT inhaler, Inhale 2 puffs into the lungs every 6 (six) hours as needed for wheezing or shortness of breath., Disp: 6.7 g, Rfl: 0   chlorhexidine (PERIOGARD) 0.12 % solution, Swish and spit 1 capful once daily, do not swallow, Disp: 473 mL, Rfl: 0   Continuous Blood Gluc Receiver (DEXCOM G6 RECEIVER) DEVI, Use as directed, Disp: 1 each, Rfl: 2   Continuous Blood Gluc Sensor (DEXCOM G6 SENSOR) MISC, Use as directed change every 10 days, Disp: 3 each, Rfl: 2   Continuous Blood Gluc Transmit (DEXCOM G6 TRANSMITTER) MISC, Use as directed, Disp: 1 each, Rfl: 2   cyclobenzaprine (FLEXERIL) 10 MG tablet, Take 1 tablet (10 mg total) by mouth at bedtime as needed., Disp: 30 tablet, Rfl: 0   Dulaglutide (TRULICITY) 6.38 LH/7.3SK SOPN, Inject 0.75 mg into the skin once a week., Disp: 2 mL, Rfl: 2   empagliflozin (JARDIANCE) 25 MG TABS tablet, Take 1 tablet (25 mg total) by mouth daily., Disp: 90 tablet, Rfl: 1   esomeprazole (NEXIUM) 40 MG capsule, Take 40 mg by mouth daily before breakfast., Disp: , Rfl:    esomeprazole (NEXIUM) 40 MG capsule, Take 1 capsule (40 mg total) by mouth daily as needed., Disp: 90 capsule, Rfl: 0   etodolac (LODINE) 400 MG tablet, Take 1 tablet (400 mg total) by mouth 2 (two) times daily as needed for pain, Disp: 60 tablet, Rfl: 0   ezetimibe (ZETIA) 10 MG tablet, Take 10 mg by mouth daily., Disp: , Rfl:    ezetimibe (ZETIA) 10 MG tablet, TAKE 1 TABLET BY MOUTH ONCE DAILY., Disp: 90 tablet, Rfl: 0   ezetimibe (ZETIA) 10 MG tablet, Take 1 tablet (10 mg total) by mouth daily., Disp: 90 tablet, Rfl: 1   losartan-hydrochlorothiazide (HYZAAR) 100-25 MG per tablet,  Take 1 tablet by mouth daily., Disp: , Rfl:    losartan-hydrochlorothiazide (HYZAAR) 100-25 MG tablet, TAKE 1 TABLET BY MOUTH ONCE DAILY., Disp: 90 tablet, Rfl: 0   losartan-hydrochlorothiazide (HYZAAR) 100-25 MG tablet, Take 1 tablet by mouth daily., Disp: 90 tablet, Rfl: 0   metFORMIN (GLUCOPHAGE) 1000 MG tablet, TAKE 1 TABLET BY MOUTH 2 TIMES A DAY, Disp: 180 tablet, Rfl: 0   metFORMIN (GLUCOPHAGE) 1000 MG tablet, Take 1 tablet (1,000 mg total) by mouth 2 (two) times daily., Disp: 180 tablet, Rfl: 1   metFORMIN (GLUCOPHAGE) 500 MG tablet, Take 500 mg by mouth daily with breakfast. , Disp: , Rfl:    rosuvastatin (CRESTOR) 40 MG tablet, Take 40 mg by mouth daily., Disp: , Rfl:    rosuvastatin (CRESTOR) 40 MG tablet, TAKE 1 TABLET BY MOUTH ONCE DAILY., Disp: 90 tablet, Rfl: 0   rosuvastatin (CRESTOR) 40 MG tablet, Take 1 tablet (40 mg total) by mouth daily., Disp: 90 tablet, Rfl: 1  Observations/Objective: Patient is  well-developed, well-nourished in no acute distress.  Resting comfortably Head is normocephalic, atraumatic.  No labored breathing.  Speech is clear and coherent with logical content.  Patient is alert and oriented at baseline.    Assessment and Plan: 1. Acute bacterial bronchitis - predniSONE (DELTASONE) 20 MG tablet; Take 1 tablet (20 mg total) by mouth daily with breakfast.  Dispense: 7 tablet; Refill: 0 - doxycycline (VIBRA-TABS) 100 MG tablet; Take 1 tablet (100 mg total) by mouth 2 (two) times daily.  Dispense: 20 tablet; Refill: 0 - benzonatate (TESSALON) 100 MG capsule; Take 1 capsule (100 mg total) by mouth 3 (three) times daily as needed.  Dispense: 30 capsule; Refill: 0  - Recurrent issue following treatment over 3 weeks ago; no true resolution of symptoms - Will treat with Doxycycline and tessalon perles - Can continue Mucinex  - Push fluids.  - Rest.  - Steam and humidifier can help - Seek in person evaluation if worsening or symptoms fail to improve     Follow Up Instructions: I discussed the assessment and treatment plan with the patient. The patient was provided an opportunity to ask questions and all were answered. The patient agreed with the plan and demonstrated an understanding of the instructions.  A copy of instructions were sent to the patient via MyChart unless otherwise noted below.    The patient was advised to call back or seek an in-person evaluation if the symptoms worsen or if the condition fails to improve as anticipated.  Time:  I spent 12 minutes with the patient via telehealth technology discussing the above problems/concerns.    Margaretann Loveless, PA-C

## 2022-03-09 ENCOUNTER — Other Ambulatory Visit (HOSPITAL_COMMUNITY): Payer: Self-pay

## 2022-04-10 ENCOUNTER — Other Ambulatory Visit: Payer: Self-pay

## 2022-04-27 DIAGNOSIS — Z0000 Encounter for general adult medical examination without abnormal findings: Secondary | ICD-10-CM | POA: Diagnosis not present

## 2022-04-27 DIAGNOSIS — Z125 Encounter for screening for malignant neoplasm of prostate: Secondary | ICD-10-CM | POA: Diagnosis not present

## 2022-04-27 DIAGNOSIS — K219 Gastro-esophageal reflux disease without esophagitis: Secondary | ICD-10-CM | POA: Diagnosis not present

## 2022-04-27 DIAGNOSIS — E7800 Pure hypercholesterolemia, unspecified: Secondary | ICD-10-CM | POA: Diagnosis not present

## 2022-04-27 DIAGNOSIS — G4730 Sleep apnea, unspecified: Secondary | ICD-10-CM | POA: Diagnosis not present

## 2022-04-27 DIAGNOSIS — E1169 Type 2 diabetes mellitus with other specified complication: Secondary | ICD-10-CM | POA: Diagnosis not present

## 2022-04-27 DIAGNOSIS — I10 Essential (primary) hypertension: Secondary | ICD-10-CM | POA: Diagnosis not present

## 2022-04-28 ENCOUNTER — Ambulatory Visit (LOCAL_COMMUNITY_HEALTH_CENTER): Payer: 59

## 2022-04-28 DIAGNOSIS — Z719 Counseling, unspecified: Secondary | ICD-10-CM

## 2022-04-28 DIAGNOSIS — Z23 Encounter for immunization: Secondary | ICD-10-CM

## 2022-04-28 NOTE — Progress Notes (Signed)
  Are you feeling sick today? No   Have you ever received a dose of COVID-19 Vaccine? [employer], [place], [place], [place], Other) Yes  If yes, which vaccine and how many doses?   PFIZER, 3   Did you bring the vaccination record card or other documentation?  No   Do you have a health condition or are undergoing treatment that makes you moderately or severely immunocompromised? This would include, but not be limited to: cancer, HIV, organ transplant, immunosuppressive therapy/high-dose corticosteroids, or moderate/severe primary immunodeficiency.  No  Have you received COVID-19 vaccine before or during hematopoietic cell transplant (HCT) or CAR-T-cell therapies? No  Have you ever had an allergic reaction to: (This would include a severe allergic reaction or a reaction that caused hives, swelling, or respiratory distress, including wheezing.) A component of a COVID-19 vaccine or a previous dose of COVID-19 vaccine? No   Have you ever had an allergic reaction to another vaccine (other thanCOVID-19 vaccine) or an injectable medication? (This would include a severe allergic reaction or a reaction that caused hives, swelling, or respiratory distress, including wheezing.)   No    Do you have a history of any of the following:  Myocarditis or Pericarditis No  Dermal fillers:  No  Multisystem Inflammatory Syndrome (MIS-C or MIS-A)? No  COVID-19 disease within the past 3 months? No  Vaccinated with monkeypox vaccine in the last 4 weeks? No  Eligible, administered Pfizer Comirnaty 12y+, [id number]. Monitored, tolerated well. Provided VIS and NCIR copy. M.Eliyanna Ault, LPN.

## 2022-05-04 ENCOUNTER — Other Ambulatory Visit (HOSPITAL_COMMUNITY): Payer: Self-pay

## 2022-05-08 ENCOUNTER — Other Ambulatory Visit (HOSPITAL_COMMUNITY): Payer: Self-pay

## 2022-05-08 MED ORDER — DEXCOM G6 TRANSMITTER MISC
2 refills | Status: DC
  Filled 2022-05-08: qty 1, 90d supply, fill #0
  Filled 2022-08-11: qty 1, 90d supply, fill #1
  Filled 2023-01-03 – 2023-04-09 (×2): qty 1, 90d supply, fill #2

## 2022-05-10 ENCOUNTER — Other Ambulatory Visit (HOSPITAL_COMMUNITY): Payer: Self-pay

## 2022-05-10 MED ORDER — DEXCOM G6 SENSOR MISC
2 refills | Status: DC
  Filled 2022-05-10: qty 3, 30d supply, fill #0
  Filled 2022-06-06: qty 3, 30d supply, fill #1
  Filled 2022-07-05: qty 3, 30d supply, fill #2

## 2022-05-11 DIAGNOSIS — M9903 Segmental and somatic dysfunction of lumbar region: Secondary | ICD-10-CM | POA: Diagnosis not present

## 2022-05-11 DIAGNOSIS — M9905 Segmental and somatic dysfunction of pelvic region: Secondary | ICD-10-CM | POA: Diagnosis not present

## 2022-05-11 DIAGNOSIS — M5459 Other low back pain: Secondary | ICD-10-CM | POA: Diagnosis not present

## 2022-05-11 DIAGNOSIS — M9904 Segmental and somatic dysfunction of sacral region: Secondary | ICD-10-CM | POA: Diagnosis not present

## 2022-05-11 DIAGNOSIS — M7918 Myalgia, other site: Secondary | ICD-10-CM | POA: Diagnosis not present

## 2022-05-22 ENCOUNTER — Other Ambulatory Visit (HOSPITAL_COMMUNITY): Payer: Self-pay

## 2022-05-23 ENCOUNTER — Other Ambulatory Visit (HOSPITAL_COMMUNITY): Payer: Self-pay

## 2022-05-23 MED ORDER — TRULICITY 0.75 MG/0.5ML ~~LOC~~ SOAJ
0.7500 mg | SUBCUTANEOUS | 2 refills | Status: DC
  Filled 2022-05-23: qty 2, 28d supply, fill #0
  Filled 2022-06-14 – 2022-06-19 (×2): qty 2, 28d supply, fill #1
  Filled 2022-07-17: qty 2, 28d supply, fill #2

## 2022-05-25 DIAGNOSIS — M5459 Other low back pain: Secondary | ICD-10-CM | POA: Diagnosis not present

## 2022-05-25 DIAGNOSIS — M7918 Myalgia, other site: Secondary | ICD-10-CM | POA: Diagnosis not present

## 2022-05-25 DIAGNOSIS — M9905 Segmental and somatic dysfunction of pelvic region: Secondary | ICD-10-CM | POA: Diagnosis not present

## 2022-05-25 DIAGNOSIS — M9903 Segmental and somatic dysfunction of lumbar region: Secondary | ICD-10-CM | POA: Diagnosis not present

## 2022-05-25 DIAGNOSIS — M9904 Segmental and somatic dysfunction of sacral region: Secondary | ICD-10-CM | POA: Diagnosis not present

## 2022-05-27 ENCOUNTER — Ambulatory Visit
Admission: EM | Admit: 2022-05-27 | Discharge: 2022-05-27 | Disposition: A | Discharge status: Home / Self Care | Payer: Commercial Managed Care - PPO | Attending: Emergency Medicine | Admitting: Emergency Medicine

## 2022-05-27 DIAGNOSIS — I10 Essential (primary) hypertension: Secondary | ICD-10-CM | POA: Diagnosis not present

## 2022-05-27 DIAGNOSIS — R051 Acute cough: Secondary | ICD-10-CM | POA: Insufficient documentation

## 2022-05-27 DIAGNOSIS — J069 Acute upper respiratory infection, unspecified: Secondary | ICD-10-CM | POA: Insufficient documentation

## 2022-05-27 DIAGNOSIS — Z1152 Encounter for screening for COVID-19: Secondary | ICD-10-CM | POA: Insufficient documentation

## 2022-05-27 HISTORY — DX: Type 2 diabetes mellitus without complications: E11.9

## 2022-05-27 MED ORDER — PROMETHAZINE-DM 6.25-15 MG/5ML PO SYRP: 5.0000 mL | ORAL_SOLUTION | Freq: Four times a day (QID) | ORAL | 0 refills | Status: DC | PRN

## 2022-05-27 NOTE — ED Triage Notes (Signed)
Patient to Urgent Care with complaints of cough and nasal congestion/ drainage. Cough is productive. Poor sleep last night.  Symptoms started two days ago.   Denies any known fevers. Has been taking nyquil/ otc cold/ flu medications.

## 2022-05-27 NOTE — ED Provider Notes (Signed)
Kerry Nelson    CSN: [id number] Arrival date & time: 05/27/22  0859      History   Chief Complaint Chief Complaint  Patient presents with   URI    HPI Kerry Nelson is a 57 y.o. male.  Patient presents with 2 day history of congestion and cough.  The cough is nonproductive; it is keeping him awake at night.  He has sore throat when he coughs.  No fever, chills, ear pain, chest pain, shortness of breath, vomiting, diarrhea, or other symptoms.  His medical history includes hypertension and diabetes.   The history is provided by the patient and medical records.    Past Medical History:  Diagnosis Date   Chest pain    Diabetes mellitus without complication (HCC)    GERD (gastroesophageal reflux disease)    Hyperlipidemia    Hypertension    Prediabetes    Sleep apnea 2010   does not use Cpap    Patient Active Problem List   Diagnosis Date Noted   Left hip pain 06/19/2016   ELBOW PAIN, LEFT 08/05/2009   SHOULDER IMPINGEMENT SYNDROME 08/05/2009   LATERAL EPICONDYLITIS 08/05/2009   ANKLE PAIN, LEFT 02/10/2009   OTHER ACQUIRED DEFORMITY OF ANKLE AND FOOT OTHER 02/10/2009   Osteoarthritis of right knee 03/10/2008   KNEE PAIN 03/10/2008   UNEQUAL LEG LENGTH 03/10/2008    Past Surgical History:  Procedure Laterality Date   CARDIAC CATHETERIZATION  2010   Dr Allyson Sabal    CORRECTION HAMMER TOE Left    FOOT SURGERY     bone spur removal   HERNIA REPAIR     LIPOMA EXCISION     x2   SHOULDER ARTHROSCOPY WITH DISTAL CLAVICLE RESECTION Left 09/17/2012   Procedure: SHOULDER ARTHROSCOPY WITH DISTAL CLAVICLE RESECTION;  Surgeon: Valeria Batman, MD;  Location: [facility] OR;  Service: Orthopedics;  Laterality: Left;  Left shoulder arthroscopy, subacromial decompression, distal clavicle resection.   UMBILICAL HERNIA REPAIR         Home Medications    Prior to Admission medications   Medication Sig Start Date End Date Taking? Authorizing Provider   promethazine-dextromethorphan (PROMETHAZINE-DM) 6.25-15 MG/5ML syrup Take 5 mLs by mouth 4 (four) times daily as needed. 05/27/22  Yes Mickie Bail, NP  albuterol (VENTOLIN HFA) 108 (90 Base) MCG/ACT inhaler Inhale 2 puffs into the lungs every 6 (six) hours as needed for wheezing or shortness of breath. 01/26/22   Waldon Merl, PA-C  benzonatate (TESSALON) 100 MG capsule Take 1 capsule (100 mg total) by mouth 3 (three) times daily as needed. 02/23/22   Margaretann Loveless, PA-C  chlorhexidine (PERIOGARD) 0.12 % solution Swish and spit 1 capful once daily, do not swallow 09/22/21     Continuous Blood Gluc Receiver (DEXCOM G6 RECEIVER) DEVI Use as directed 10/28/21     Continuous Blood Gluc Sensor (DEXCOM G6 SENSOR) MISC Use as directed change every 10 days 05/10/22     Continuous Blood Gluc Transmit (DEXCOM G6 TRANSMITTER) MISC Use as directed 10/28/21     Continuous Blood Gluc Transmit (DEXCOM G6 TRANSMITTER) MISC Use as directed 05/07/22     cyclobenzaprine (FLEXERIL) 10 MG tablet Take 1 tablet (10 mg total) by mouth at bedtime as needed. 01/06/21     doxycycline (VIBRA-TABS) 100 MG tablet Take 1 tablet (100 mg total) by mouth 2 (two) times daily. 02/23/22   Margaretann Loveless, PA-C  Dulaglutide (TRULICITY) 0.75 MG/0.5ML SOPN Inject 0.75 mg into the skin once  a week. 05/23/22     empagliflozin (JARDIANCE) 25 MG TABS tablet Take 1 tablet (25 mg total) by mouth daily. 06/08/21     esomeprazole (NEXIUM) 40 MG capsule Take 40 mg by mouth daily before breakfast.    [provider]  esomeprazole (NEXIUM) 40 MG capsule Take 1 capsule (40 mg total) by mouth daily as needed. 02/10/22     etodolac (LODINE) 400 MG tablet Take 1 tablet (400 mg total) by mouth 2 (two) times daily as needed for pain 08/12/20     ezetimibe (ZETIA) 10 MG tablet Take 10 mg by mouth daily.    [provider]  ezetimibe (ZETIA) 10 MG tablet TAKE 1 TABLET BY MOUTH ONCE DAILY. 07/29/20 07/29/21  Redmon, Altamease Oiler, PA   ezetimibe (ZETIA) 10 MG tablet Take 1 tablet (10 mg total) by mouth daily. 06/08/21     losartan-hydrochlorothiazide (HYZAAR) 100-25 MG per tablet Take 1 tablet by mouth daily.    [provider]  losartan-hydrochlorothiazide (HYZAAR) 100-25 MG tablet TAKE 1 TABLET BY MOUTH ONCE DAILY. 07/29/20 07/29/21  Redmon, Altamease Oiler, PA  losartan-hydrochlorothiazide (HYZAAR) 100-25 MG tablet Take 1 tablet by mouth daily. 02/10/22     metFORMIN (GLUCOPHAGE) 1000 MG tablet TAKE 1 TABLET BY MOUTH 2 TIMES A DAY 07/29/20 07/29/21  Redmon, Altamease Oiler, PA  metFORMIN (GLUCOPHAGE) 1000 MG tablet Take 1 tablet (1,000 mg total) by mouth 2 (two) times daily. 02/10/22     metFORMIN (GLUCOPHAGE) 500 MG tablet Take 500 mg by mouth daily with breakfast.     [provider]  predniSONE (DELTASONE) 20 MG tablet Take 1 tablet (20 mg total) by mouth daily with breakfast. 02/23/22   Margaretann Loveless, PA-C  rosuvastatin (CRESTOR) 40 MG tablet Take 40 mg by mouth daily.    [provider]  rosuvastatin (CRESTOR) 40 MG tablet TAKE 1 TABLET BY MOUTH ONCE DAILY. 07/29/20 07/29/21  Redmon, Altamease Oiler, PA  rosuvastatin (CRESTOR) 40 MG tablet Take 1 tablet (40 mg total) by mouth daily. 06/08/21       Family History Family History  Problem Relation Age of Onset   Asthma Mother    Depression Mother    Hypertension Brother    Seizures Brother     Social History Social History   Tobacco Use   Smoking status: Former    Packs/day: 0.50    Years: 25.00    Total pack years: 12.50    Types: Cigarettes    Quit date: 09/21/2013    Years since quitting: 8.6   Smokeless tobacco: Never  Vaping Use   Vaping Use: Never used  Substance Use Topics   Alcohol use: Yes    Alcohol/week: 6.0 standard drinks of alcohol    Types: 6 Cans of beer per week   Drug use: No     Allergies   Aleve [naproxen]   Review of Systems Review of Systems  Constitutional:  Negative for chills and fever.  HENT:  Positive for congestion.  Negative for ear pain and sore throat.   Respiratory:  Positive for cough. Negative for shortness of breath.   Cardiovascular:  Negative for chest pain and palpitations.  Gastrointestinal:  Negative for diarrhea and vomiting.  Skin:  Negative for color change and rash.  All other systems reviewed and are negative.    Physical Exam Triage Vital Signs ED Triage Vitals  Enc Vitals Group     BP      Pulse      Resp  Temp      Temp src      SpO2      Weight      Height      Head Circumference      Peak Flow      Pain Score      Pain Loc      Pain Edu?      Excl. in [place]?    No data found.  Updated Vital Signs BP (!) 131/90   Pulse 98   Temp 99 F (37.2 C)   Resp 18   SpO2 95%   Visual Acuity Right Eye Distance:   Left Eye Distance:   Bilateral Distance:    Right Eye Near:   Left Eye Near:    Bilateral Near:     Physical Exam Vitals and nursing note reviewed.  Constitutional:      General: He is not in acute distress.    Appearance: He is well-developed. He is obese. He is not ill-appearing.  HENT:     Right Ear: Tympanic membrane normal.     Left Ear: Tympanic membrane normal.     Nose: Nose normal.     Mouth/Throat:     Mouth: Mucous membranes are moist.     Pharynx: Oropharynx is clear.  Cardiovascular:     Rate and Rhythm: Normal rate and regular rhythm.     Heart sounds: Normal heart sounds.  Pulmonary:     Effort: Pulmonary effort is normal. No respiratory distress.     Breath sounds: Normal breath sounds.  Musculoskeletal:     Cervical back: Neck supple.  Skin:    General: Skin is warm and dry.  Neurological:     Mental Status: He is alert.  Psychiatric:        Mood and Affect: Mood normal.        Behavior: Behavior normal.      UC Treatments / Results  Labs (all labs ordered are listed, but only abnormal results are displayed) Labs Reviewed - No data to display  EKG   Radiology No results found.  Procedures Procedures  (including critical care time)  Medications Ordered in UC Medications - No data to display  Initial Impression / Assessment and Plan / UC Course  I have reviewed the triage vital signs and the nursing notes.  Pertinent labs & imaging results that were available during my care of the patient were reviewed by me and considered in my medical decision making (see chart for details).   Cough, viral URI, elevated blood pressure with HTN.  Treating cough with promethazine DM; precautions for drowsiness with this medication discussed.  Discussed other symptomatic treatment including Tylenol or ibuprofen.  Instructed patient to follow up with his PCP if symptoms are not improving.  Also discussed with patient that his blood pressure is elevated today and needs to be rechecked by PCP in 2 to 4 weeks.  Education provided on managing hypertension.  He agrees to plan of care.    Final Clinical Impressions(s) / UC Diagnoses   Final diagnoses:  Acute cough  Viral URI  Elevated blood pressure reading in office with diagnosis of hypertension     Discharge Instructions      Take the promethazine DM as directed for cough.  Do not drive, operate machinery, drink alcohol, or perform dangerous activities while taking this medication as it may cause drowsiness.  Follow up with your primary care provider if your symptoms are not improving.  Your blood pressure is elevated today at 149/98.  Please have this rechecked by your primary care provider in 2-4 weeks.          ED Prescriptions     Medication Sig Dispense Auth. Provider   promethazine-dextromethorphan (PROMETHAZINE-DM) 6.25-15 MG/5ML syrup Take 5 mLs by mouth 4 (four) times daily as needed. 118 mL Sharion Balloon, NP      PDMP not reviewed this encounter.   Sharion Balloon, NP 05/27/22 [phone]

## 2022-05-27 NOTE — Discharge Instructions (Addendum)
Take the promethazine DM as directed for cough.  Do not drive, operate machinery, drink alcohol, or perform dangerous activities while taking this medication as it may cause drowsiness.  Follow up with your primary care provider if your symptoms are not improving.    Your blood pressure is elevated today at 149/98.  Please have this rechecked by your primary care provider in 2-4 weeks.

## 2022-05-29 LAB — SARS CORONAVIRUS 2 (TAT 6-24 HRS): SARS Coronavirus 2: NEGATIVE

## 2022-06-06 ENCOUNTER — Other Ambulatory Visit: Payer: Self-pay

## 2022-06-19 ENCOUNTER — Other Ambulatory Visit (HOSPITAL_COMMUNITY): Payer: Self-pay

## 2022-07-06 ENCOUNTER — Other Ambulatory Visit (HOSPITAL_COMMUNITY): Payer: Self-pay

## 2022-07-06 DIAGNOSIS — J019 Acute sinusitis, unspecified: Secondary | ICD-10-CM | POA: Diagnosis not present

## 2022-07-06 MED ORDER — AMOXICILLIN-POT CLAVULANATE 875-125 MG PO TABS
1.0000 | ORAL_TABLET | Freq: Two times a day (BID) | ORAL | 0 refills | Status: DC
  Filled 2022-07-06: qty 20, 10d supply, fill #0

## 2022-07-18 ENCOUNTER — Other Ambulatory Visit: Payer: Self-pay

## 2022-07-20 DIAGNOSIS — M5459 Other low back pain: Secondary | ICD-10-CM | POA: Diagnosis not present

## 2022-07-20 DIAGNOSIS — M9905 Segmental and somatic dysfunction of pelvic region: Secondary | ICD-10-CM | POA: Diagnosis not present

## 2022-07-20 DIAGNOSIS — M7918 Myalgia, other site: Secondary | ICD-10-CM | POA: Diagnosis not present

## 2022-07-20 DIAGNOSIS — M9903 Segmental and somatic dysfunction of lumbar region: Secondary | ICD-10-CM | POA: Diagnosis not present

## 2022-07-20 DIAGNOSIS — M9904 Segmental and somatic dysfunction of sacral region: Secondary | ICD-10-CM | POA: Diagnosis not present

## 2022-08-11 ENCOUNTER — Other Ambulatory Visit: Payer: Self-pay

## 2022-08-11 ENCOUNTER — Other Ambulatory Visit (HOSPITAL_COMMUNITY): Payer: Self-pay

## 2022-08-11 MED ORDER — DEXCOM G6 SENSOR MISC
2 refills | Status: AC
  Filled 2022-08-11: qty 3, 30d supply, fill #0
  Filled 2023-01-03 – 2023-02-19 (×2): qty 3, 30d supply, fill #1
  Filled 2023-03-21: qty 3, 30d supply, fill #2

## 2022-08-11 MED ORDER — TRULICITY 0.75 MG/0.5ML ~~LOC~~ SOAJ
0.7500 mg | SUBCUTANEOUS | 2 refills | Status: DC
  Filled 2022-08-11: qty 2, 28d supply, fill #0
  Filled 2022-09-11: qty 2, 28d supply, fill #1
  Filled 2022-10-24: qty 2, 28d supply, fill #2

## 2022-08-28 ENCOUNTER — Ambulatory Visit (HOSPITAL_BASED_OUTPATIENT_CLINIC_OR_DEPARTMENT_OTHER): Payer: Commercial Managed Care - PPO | Admitting: Internal Medicine

## 2022-08-28 ENCOUNTER — Encounter (HOSPITAL_BASED_OUTPATIENT_CLINIC_OR_DEPARTMENT_OTHER): Payer: Self-pay

## 2022-08-28 ENCOUNTER — Encounter (HOSPITAL_BASED_OUTPATIENT_CLINIC_OR_DEPARTMENT_OTHER): Payer: Self-pay | Admitting: Internal Medicine

## 2022-08-28 ENCOUNTER — Telehealth: Payer: Self-pay | Admitting: Internal Medicine

## 2022-08-28 VITALS — BP 150/90 | HR 87 | Ht 72.0 in | Wt 323.2 lb

## 2022-08-28 DIAGNOSIS — E785 Hyperlipidemia, unspecified: Secondary | ICD-10-CM

## 2022-08-28 DIAGNOSIS — I10 Essential (primary) hypertension: Secondary | ICD-10-CM | POA: Diagnosis not present

## 2022-08-28 DIAGNOSIS — G4730 Sleep apnea, unspecified: Secondary | ICD-10-CM

## 2022-08-28 DIAGNOSIS — G4733 Obstructive sleep apnea (adult) (pediatric): Secondary | ICD-10-CM

## 2022-08-28 DIAGNOSIS — E119 Type 2 diabetes mellitus without complications: Secondary | ICD-10-CM | POA: Diagnosis not present

## 2022-08-28 DIAGNOSIS — Z91148 Patient's other noncompliance with medication regimen for other reason: Secondary | ICD-10-CM | POA: Diagnosis not present

## 2022-08-28 DIAGNOSIS — M109 Gout, unspecified: Secondary | ICD-10-CM

## 2022-08-28 DIAGNOSIS — E7800 Pure hypercholesterolemia, unspecified: Secondary | ICD-10-CM

## 2022-08-28 DIAGNOSIS — M1990 Unspecified osteoarthritis, unspecified site: Secondary | ICD-10-CM

## 2022-08-28 NOTE — Telephone Encounter (Signed)
error 

## 2022-08-28 NOTE — Patient Instructions (Signed)
Medication Instructions:   RESUME rosuvastatin and zetia as prescribed   *If you need a refill on your cardiac medications before your next appointment, please call your pharmacy*   Lab Work: FASTING lab work to check cholesterol as soon able   FASTING lab work in 3-4 months to check cholesterol before next appointment  If you have labs (blood work) drawn today and your tests are completely normal, you will receive your results only by: MyChart Message (if you have MyChart) OR A paper copy in the mail If you have any lab test that is abnormal or we need to change your treatment, we will call you to review the results.    Follow-Up: At [facility], you and your health needs are our priority.  As part of our continuing mission to provide you with exceptional heart care, we have created designated Provider Care Teams.  These Care Teams include your primary Cardiologist (physician) and Advanced Practice Providers (APPs -  Physician Assistants and Nurse Practitioners) who all work together to provide you with the care you need, when you need it.  We recommend signing up for the patient portal called "MyChart".  Sign up information is provided on this After Visit Summary.  MyChart is used to connect with patients for Virtual Visits (Telemedicine).  Patients are able to view lab/test results, encounter notes, upcoming appointments, etc.  Non-urgent messages can be sent to your provider as well.   To learn more about what you can do with MyChart, go to [email or web address].    Your next appointment:    3-4 months with Dr. Rennis Golden or Marcelino Duster NP

## 2022-08-28 NOTE — Progress Notes (Signed)
LIPID CLINIC CONSULT NOTE  Chief Complaint:  Manage dyslipidemia  Primary Care Physician: Milus Height, PA  Primary Cardiologist:  None  HPI:  Kerry Nelson is a 57 y.o. male who is being seen today for the evaluation of dyslipidemia at the request of Redmon, [place], [place].  This is a pleasant 57 year old male who works for [employer] and has a history of type 2 diabetes, dyslipidemia, sleep apnea (not on CPAP) and chest pain in the past with cardiac catheterization after an abnormal stress test in 2010 which did not show any significant coronary disease.  Recently was referred back for evaluation management of dyslipidemia.  In December his total cholesterol was 261, HDL 67, triglycerides 192 and LDL 159.  He has been managed on combination therapy with rosuvastatin 40 mg daily and ezetimibe 10 mg daily.  After discussing with him today he reports that his compliance with medications is suboptimal.  He says perhaps he may miss medications 4 out of the 7 days a week.  We try to identify number of reasons for this.  Despite that he has very compliant with exercise and lifestyle modifications however this is likely playing a role in his undertreated cholesterol.  PMHx:  Past Medical History:  Diagnosis Date   Chest pain    Diabetes mellitus without complication (HCC)    GERD (gastroesophageal reflux disease)    Hyperlipidemia    Hypertension    Prediabetes    Sleep apnea 2010   does not use Cpap    Past Surgical History:  Procedure Laterality Date   CARDIAC CATHETERIZATION  2010   Dr Allyson Sabal    CORRECTION HAMMER TOE Left    FOOT SURGERY     bone spur removal   HERNIA REPAIR     LIPOMA EXCISION     x2   SHOULDER ARTHROSCOPY WITH DISTAL CLAVICLE RESECTION Left 09/17/2012   Procedure: SHOULDER ARTHROSCOPY WITH DISTAL CLAVICLE RESECTION;  Surgeon: Valeria Batman, MD;  Location: [facility] OR;  Service: Orthopedics;  Laterality: Left;  Left shoulder arthroscopy, subacromial  decompression, distal clavicle resection.   UMBILICAL HERNIA REPAIR      FAMHx:  Family History  Problem Relation Age of Onset   Asthma Mother    Depression Mother    Hypertension Brother    Seizures Brother     SOCHx:   reports that he quit smoking about 8 years ago. His smoking use included cigarettes. He has a 12.50 pack-year smoking history. He has never used smokeless tobacco. He reports current alcohol use of about 6.0 standard drinks of alcohol per week. He reports that he does not use drugs.  ALLERGIES:  Allergies  Allergen Reactions   Aleve [Naproxen] Palpitations    ROS: Pertinent items noted in HPI and remainder of comprehensive ROS otherwise negative.  HOME MEDS: Current Outpatient Medications on File Prior to Visit  Medication Sig Dispense Refill   Continuous Blood Gluc Receiver (DEXCOM G6 RECEIVER) DEVI Use as directed 1 each 2   Continuous Blood Gluc Sensor (DEXCOM G6 SENSOR) MISC Change sensor as directed every 10 days 3 each 2   Continuous Blood Gluc Transmit (DEXCOM G6 TRANSMITTER) MISC Use as directed 1 each 2   Continuous Blood Gluc Transmit (DEXCOM G6 TRANSMITTER) MISC Use as directed 1 each 2   Dulaglutide (TRULICITY) 0.75 MG/0.5ML SOPN Inject 0.75 mg into the skin once a week. 2 mL 2   empagliflozin (JARDIANCE) 25 MG TABS tablet Take 1 tablet (25 mg total)  by mouth daily. 90 tablet 1   esomeprazole (NEXIUM) 40 MG capsule Take 1 capsule (40 mg total) by mouth daily as needed. 90 capsule 0   ezetimibe (ZETIA) 10 MG tablet Take 1 tablet (10 mg total) by mouth daily. 90 tablet 1   losartan-hydrochlorothiazide (HYZAAR) 100-25 MG tablet Take 1 tablet by mouth daily. 90 tablet 0   metFORMIN (GLUCOPHAGE) 1000 MG tablet Take 1 tablet (1,000 mg total) by mouth 2 (two) times daily. 180 tablet 1   rosuvastatin (CRESTOR) 40 MG tablet Take 1 tablet (40 mg total) by mouth daily. 90 tablet 1   No current facility-administered medications on file prior to visit.     LABS/IMAGING: No results found for this or any previous visit (from the past 48 hour(s)). No results found.  LIPID PANEL: No results found for: "CHOL", "TRIG", "HDL", "CHOLHDL", "VLDL", "LDLCALC", "LDLDIRECT"  WEIGHTS: Wt Readings from Last 3 Encounters:  08/28/22 (!) 323 lb 3.2 oz (146.6 kg)  04/19/21 (!) 310 lb (140.6 kg)  04/07/21 (!) 310 lb (140.6 kg)    VITALS: BP (!) 150/90 (BP Location: Right Arm, Patient Position: Sitting, Cuff Size: Large)   Pulse 87   Ht 6' (1.829 m)   Wt (!) 323 lb 3.2 oz (146.6 kg)   SpO2 98%   BMI 43.83 kg/m   EXAM: Deferred  EKG: Deferred  ASSESSMENT: Mixed dyslipidemia, goal LDL less than 70 Medication noncompliance Type 2 diabetes Morbid obesity Essential hypertension History of OSA-untreated  PLAN: 1.   [name] has a mixed dyslipidemia and remains above target LDL less than 70.  He has been noncompliant with his medications for more than 50% of the time and says that he is going to try to do better with that.  He would also benefit from weight loss and better blood pressure control and notes poor sleep with a history of sleep apnea being untreated with the medication.  He may benefit from a home sleep study and consideration for treatment for this.  I will defer to his PCP to work on this but I set a follow-up lipid profile for 3 to 4 months from now to see if he is able to get his lipids lower with better medication compliance.  We may need to consider other options at that point.  Thanks for the kind referral.  Chrystie Nose, MD, [facility]  [place]  [facility]  Medical Director of the Advanced Lipid Disorders &  Cardiovascular Risk Reduction Clinic Diplomate of the American Board of Clinical Lipidology Attending Cardiologist  Direct Dial: [phone]  Fax: [phone]  Website:  [email or web address]  Chrystie Nose 08/28/2022, 4:32 PM

## 2022-08-31 LAB — NMR, LIPOPROFILE
Cholesterol, Total: 228 mg/dL — ABNORMAL HIGH (ref 100–199)
HDL Particle Number: 43.5 umol/L (ref >=30.5)
HDL-C: 54 mg/dL (ref >39)
LDL Particle Number: 1519 nmol/L — ABNORMAL HIGH (ref <1000)
LDL Size: 20.5 nm — ABNORMAL LOW (ref >20.5)
LDL-C (NIH Calc): 115 mg/dL — ABNORMAL HIGH (ref 0–99)
LP-IR Score: 81 — ABNORMAL HIGH (ref <=45)
Small LDL Particle Number: 938 nmol/L — ABNORMAL HIGH (ref <=527)
Triglycerides: 344 mg/dL — ABNORMAL HIGH (ref 0–149)

## 2022-08-31 LAB — LIPOPROTEIN A (LPA): Lipoprotein (a): 83.7 nmol/L — ABNORMAL HIGH (ref <75.0)

## 2022-09-21 ENCOUNTER — Other Ambulatory Visit: Payer: Self-pay | Admitting: Internal Medicine

## 2022-09-21 DIAGNOSIS — E785 Hyperlipidemia, unspecified: Secondary | ICD-10-CM

## 2022-10-17 DIAGNOSIS — G4719 Other hypersomnia: Secondary | ICD-10-CM | POA: Diagnosis not present

## 2022-10-17 DIAGNOSIS — I10 Essential (primary) hypertension: Secondary | ICD-10-CM | POA: Diagnosis not present

## 2022-10-31 ENCOUNTER — Other Ambulatory Visit (HOSPITAL_COMMUNITY): Payer: Self-pay

## 2022-10-31 DIAGNOSIS — G4730 Sleep apnea, unspecified: Secondary | ICD-10-CM | POA: Diagnosis not present

## 2022-10-31 DIAGNOSIS — M109 Gout, unspecified: Secondary | ICD-10-CM | POA: Diagnosis not present

## 2022-10-31 DIAGNOSIS — E7800 Pure hypercholesterolemia, unspecified: Secondary | ICD-10-CM | POA: Diagnosis not present

## 2022-10-31 DIAGNOSIS — R809 Proteinuria, unspecified: Secondary | ICD-10-CM | POA: Diagnosis not present

## 2022-10-31 DIAGNOSIS — E1169 Type 2 diabetes mellitus with other specified complication: Secondary | ICD-10-CM | POA: Diagnosis not present

## 2022-10-31 DIAGNOSIS — K219 Gastro-esophageal reflux disease without esophagitis: Secondary | ICD-10-CM | POA: Diagnosis not present

## 2022-10-31 MED ORDER — KERENDIA 10 MG PO TABS
10.0000 mg | ORAL_TABLET | Freq: Every day | ORAL | 3 refills | Status: DC
  Filled 2022-10-31 – 2022-11-16 (×3): qty 90, 90d supply, fill #0
  Filled 2023-04-02: qty 90, 90d supply, fill #1
  Filled 2023-08-05: qty 90, 90d supply, fill #2

## 2022-10-31 MED ORDER — ROSUVASTATIN CALCIUM 40 MG PO TABS
40.0000 mg | ORAL_TABLET | Freq: Every day | ORAL | 3 refills | Status: DC
  Filled 2022-10-31: qty 90, 90d supply, fill #0
  Filled 2023-02-25: qty 90, 90d supply, fill #1
  Filled 2023-08-05: qty 90, 90d supply, fill #2

## 2022-11-07 ENCOUNTER — Other Ambulatory Visit (HOSPITAL_COMMUNITY): Payer: Self-pay

## 2022-11-07 MED ORDER — TRULICITY 1.5 MG/0.5ML ~~LOC~~ SOAJ
1.5000 mg | SUBCUTANEOUS | 1 refills | Status: AC
  Filled 2022-11-07 – 2023-01-03 (×2): qty 6, 84d supply, fill #0

## 2022-11-10 ENCOUNTER — Other Ambulatory Visit (HOSPITAL_COMMUNITY): Payer: Self-pay

## 2022-11-14 ENCOUNTER — Other Ambulatory Visit (HOSPITAL_COMMUNITY): Payer: Self-pay

## 2022-11-14 DIAGNOSIS — M7918 Myalgia, other site: Secondary | ICD-10-CM | POA: Diagnosis not present

## 2022-11-14 DIAGNOSIS — G4733 Obstructive sleep apnea (adult) (pediatric): Secondary | ICD-10-CM | POA: Diagnosis not present

## 2022-11-14 DIAGNOSIS — M5459 Other low back pain: Secondary | ICD-10-CM | POA: Diagnosis not present

## 2022-11-14 DIAGNOSIS — M9903 Segmental and somatic dysfunction of lumbar region: Secondary | ICD-10-CM | POA: Diagnosis not present

## 2022-11-14 DIAGNOSIS — M9904 Segmental and somatic dysfunction of sacral region: Secondary | ICD-10-CM | POA: Diagnosis not present

## 2022-11-14 DIAGNOSIS — I10 Essential (primary) hypertension: Secondary | ICD-10-CM | POA: Diagnosis not present

## 2022-11-14 DIAGNOSIS — M9905 Segmental and somatic dysfunction of pelvic region: Secondary | ICD-10-CM | POA: Diagnosis not present

## 2022-11-14 MED ORDER — EZETIMIBE 10 MG PO TABS
10.0000 mg | ORAL_TABLET | Freq: Every day | ORAL | 3 refills | Status: DC
  Filled 2022-11-14: qty 90, 90d supply, fill #0
  Filled 2023-02-25: qty 90, 90d supply, fill #1
  Filled 2023-08-05: qty 90, 90d supply, fill #2

## 2022-11-14 MED ORDER — JARDIANCE 25 MG PO TABS
25.0000 mg | ORAL_TABLET | Freq: Every day | ORAL | 3 refills | Status: DC
  Filled 2022-11-14: qty 90, 90d supply, fill #0
  Filled 2023-03-09: qty 90, 90d supply, fill #1
  Filled 2023-08-05: qty 90, 90d supply, fill #2

## 2022-11-14 MED ORDER — ESOMEPRAZOLE MAGNESIUM 40 MG PO CPDR
40.0000 mg | DELAYED_RELEASE_CAPSULE | Freq: Every day | ORAL | 3 refills | Status: DC | PRN
  Filled 2022-11-14: qty 90, 90d supply, fill #0
  Filled 2023-03-09: qty 90, 90d supply, fill #1
  Filled 2023-07-28: qty 90, 90d supply, fill #2

## 2022-11-14 MED ORDER — TRULICITY 1.5 MG/0.5ML ~~LOC~~ SOAJ
1.5000 mg | SUBCUTANEOUS | 1 refills | Status: AC
  Filled 2022-11-14: qty 2, 28d supply, fill #0

## 2022-11-16 ENCOUNTER — Other Ambulatory Visit (HOSPITAL_COMMUNITY): Payer: Self-pay

## 2022-11-29 ENCOUNTER — Other Ambulatory Visit (HOSPITAL_COMMUNITY): Payer: Self-pay

## 2022-11-29 DIAGNOSIS — G4733 Obstructive sleep apnea (adult) (pediatric): Secondary | ICD-10-CM | POA: Diagnosis not present

## 2022-12-01 ENCOUNTER — Encounter: Payer: Self-pay | Admitting: Internal Medicine

## 2022-12-01 ENCOUNTER — Ambulatory Visit: Payer: Commercial Managed Care - PPO | Attending: Internal Medicine | Admitting: Internal Medicine

## 2022-12-01 VITALS — BP 122/68 | HR 86 | Ht 72.0 in | Wt 309.6 lb

## 2022-12-01 DIAGNOSIS — E119 Type 2 diabetes mellitus without complications: Secondary | ICD-10-CM

## 2022-12-01 DIAGNOSIS — G4733 Obstructive sleep apnea (adult) (pediatric): Secondary | ICD-10-CM | POA: Diagnosis not present

## 2022-12-01 DIAGNOSIS — E785 Hyperlipidemia, unspecified: Secondary | ICD-10-CM | POA: Diagnosis not present

## 2022-12-01 NOTE — Patient Instructions (Signed)

## 2022-12-01 NOTE — Progress Notes (Unsigned)
LIPID CLINIC CONSULT NOTE  Chief Complaint:  Manage dyslipidemia  Primary Care Physician: Milus Height, PA  Primary Cardiologist:  None  HPI:  Kerry Nelson is a 57 y.o. male who is being seen today for the evaluation of dyslipidemia at the request of Redmon, [place], [place].  This is a pleasant 57 year old male who works for [employer] and has a history of type 2 diabetes, dyslipidemia, sleep apnea (not on CPAP) and chest pain in the past with cardiac catheterization after an abnormal stress test in 2010 which did not show any significant coronary disease.  Recently was referred back for evaluation management of dyslipidemia.  In December his total cholesterol was 261, HDL 67, triglycerides 192 and LDL 159.  He has been managed on combination therapy with rosuvastatin 40 mg daily and ezetimibe 10 mg daily.  After discussing with him today he reports that his compliance with medications is suboptimal.  He says perhaps he may miss medications 4 out of the 7 days a week.  We try to identify number of reasons for this.  Despite that he has very compliant with exercise and lifestyle modifications however this is likely playing a role in his undertreated cholesterol.  PMHx:  Past Medical History:  Diagnosis Date   Chest pain    Diabetes mellitus without complication (HCC)    GERD (gastroesophageal reflux disease)    Hyperlipidemia    Hypertension    Prediabetes    Sleep apnea 2010   does not use Cpap    Past Surgical History:  Procedure Laterality Date   CARDIAC CATHETERIZATION  2010   Dr Allyson Sabal    CORRECTION HAMMER TOE Left    FOOT SURGERY     bone spur removal   HERNIA REPAIR     LIPOMA EXCISION     x2   SHOULDER ARTHROSCOPY WITH DISTAL CLAVICLE RESECTION Left 09/17/2012   Procedure: SHOULDER ARTHROSCOPY WITH DISTAL CLAVICLE RESECTION;  Surgeon: Valeria Batman, MD;  Location: [facility] OR;  Service: Orthopedics;  Laterality: Left;  Left shoulder arthroscopy, subacromial  decompression, distal clavicle resection.   UMBILICAL HERNIA REPAIR      FAMHx:  Family History  Problem Relation Age of Onset   Asthma Mother    Depression Mother    Hypertension Brother    Seizures Brother     SOCHx:   reports that he quit smoking about 9 years ago. His smoking use included cigarettes. He started smoking about 34 years ago. He has a 12.5 pack-year smoking history. He has never used smokeless tobacco. He reports current alcohol use of about 6.0 standard drinks of alcohol per week. He reports that he does not use drugs.  ALLERGIES:  Allergies  Allergen Reactions   Aleve [Naproxen] Palpitations    ROS: Pertinent items noted in HPI and remainder of comprehensive ROS otherwise negative.  HOME MEDS: Current Outpatient Medications on File Prior to Visit  Medication Sig Dispense Refill   Continuous Blood Gluc Receiver (DEXCOM G6 RECEIVER) DEVI Use as directed 1 each 2   Continuous Blood Gluc Sensor (DEXCOM G6 SENSOR) MISC Change sensor as directed every 10 days 3 each 2   Continuous Blood Gluc Transmit (DEXCOM G6 TRANSMITTER) MISC Use as directed 1 each 2   Continuous Blood Gluc Transmit (DEXCOM G6 TRANSMITTER) MISC Use as directed 1 each 2   Dulaglutide (TRULICITY) 1.5 MG/0.5ML SOPN Inject 1.5 mg into the skin once a week. 6 mL 1   Dulaglutide (TRULICITY) 1.5 MG/0.5ML SOPN  Inject 1.5 mg into the skin once a week. 6 mL 1   empagliflozin (JARDIANCE) 25 MG TABS tablet Take 1 tablet (25 mg total) by mouth daily. 90 tablet 3   esomeprazole (NEXIUM) 40 MG capsule Take 1 capsule (40 mg total) by mouth daily as needed. 90 capsule 3   ezetimibe (ZETIA) 10 MG tablet Take 1 tablet (10 mg total) by mouth daily. 90 tablet 3   Finerenone (KERENDIA) 10 MG TABS Take 1 tablet (10 mg total) by mouth daily. 90 tablet 3   losartan-hydrochlorothiazide (HYZAAR) 100-25 MG tablet Take 1 tablet by mouth daily. 90 tablet 0   metFORMIN (GLUCOPHAGE) 1000 MG tablet Take 1 tablet (1,000 mg  total) by mouth 2 (two) times daily. 180 tablet 1   rosuvastatin (CRESTOR) 40 MG tablet Take 1 tablet (40 mg total) by mouth daily. 90 tablet 1   rosuvastatin (CRESTOR) 40 MG tablet Take 1 tablet (40 mg total) by mouth daily. 90 tablet 3   No current facility-administered medications on file prior to visit.    LABS/IMAGING: No results found for this or any previous visit (from the past 48 hour(s)). No results found.  LIPID PANEL: No results found for: "CHOL", "TRIG", "HDL", "CHOLHDL", "VLDL", "LDLCALC", "LDLDIRECT"  WEIGHTS: Wt Readings from Last 3 Encounters:  12/01/22 (!) 309 lb 9.6 oz (140.4 kg)  08/28/22 (!) 323 lb 3.2 oz (146.6 kg)  04/19/21 (!) 310 lb (140.6 kg)    VITALS: BP 122/68 (BP Location: Left Arm, Patient Position: Sitting, Cuff Size: Large)   Pulse 86   Ht 6' (1.829 m)   Wt (!) 309 lb 9.6 oz (140.4 kg)   SpO2 95%   BMI 41.99 kg/m   EXAM: Deferred  EKG: Deferred  ASSESSMENT: Mixed dyslipidemia, goal LDL less than 70 Medication noncompliance Type 2 diabetes Morbid obesity Essential hypertension History of OSA-untreated  PLAN: 1.   Mr. Chermak has a mixed dyslipidemia and remains above target LDL less than 70.  He has been noncompliant with his medications for more than 50% of the time and says that he is going to try to do better with that.  He would also benefit from weight loss and better blood pressure control and notes poor sleep with a history of sleep apnea being untreated with the medication.  He may benefit from a home sleep study and consideration for treatment for this.  I will defer to his PCP to work on this but I set a follow-up lipid profile for 3 to 4 months from now to see if he is able to get his lipids lower with better medication compliance.  We may need to consider other options at that point.  Thanks for the kind referral.  Chrystie Nose, MD, [facility]  [place]  [facility]  Medical Director of the Advanced Lipid Disorders  &  Cardiovascular Risk Reduction Clinic Diplomate of the American Board of Clinical Lipidology Attending Cardiologist  Direct Dial: [phone]  Fax: [phone]  Website:  [email or web address]  Lisette Abu  12/01/2022, 2:26 PM

## 2022-12-11 ENCOUNTER — Other Ambulatory Visit (HOSPITAL_COMMUNITY): Payer: Self-pay

## 2022-12-11 MED ORDER — LOSARTAN POTASSIUM-HCTZ 100-25 MG PO TABS
1.0000 | ORAL_TABLET | Freq: Every day | ORAL | 0 refills | Status: DC
  Filled 2022-12-11: qty 90, 90d supply, fill #0

## 2022-12-30 DIAGNOSIS — G4733 Obstructive sleep apnea (adult) (pediatric): Secondary | ICD-10-CM | POA: Diagnosis not present

## 2023-01-03 ENCOUNTER — Other Ambulatory Visit (HOSPITAL_COMMUNITY): Payer: Self-pay

## 2023-01-09 ENCOUNTER — Other Ambulatory Visit (HOSPITAL_COMMUNITY): Payer: Self-pay

## 2023-01-09 ENCOUNTER — Other Ambulatory Visit: Payer: Self-pay

## 2023-01-15 ENCOUNTER — Other Ambulatory Visit (HOSPITAL_COMMUNITY): Payer: Self-pay

## 2023-01-29 DIAGNOSIS — G4733 Obstructive sleep apnea (adult) (pediatric): Secondary | ICD-10-CM | POA: Diagnosis not present

## 2023-02-07 DIAGNOSIS — H40013 Open angle with borderline findings, low risk, bilateral: Secondary | ICD-10-CM | POA: Diagnosis not present

## 2023-02-07 DIAGNOSIS — H524 Presbyopia: Secondary | ICD-10-CM | POA: Diagnosis not present

## 2023-02-08 DIAGNOSIS — E1169 Type 2 diabetes mellitus with other specified complication: Secondary | ICD-10-CM | POA: Diagnosis not present

## 2023-02-20 ENCOUNTER — Other Ambulatory Visit (HOSPITAL_COMMUNITY): Payer: Self-pay

## 2023-02-20 DIAGNOSIS — I10 Essential (primary) hypertension: Secondary | ICD-10-CM | POA: Diagnosis not present

## 2023-02-20 DIAGNOSIS — G4733 Obstructive sleep apnea (adult) (pediatric): Secondary | ICD-10-CM | POA: Diagnosis not present

## 2023-03-01 DIAGNOSIS — G4733 Obstructive sleep apnea (adult) (pediatric): Secondary | ICD-10-CM | POA: Diagnosis not present

## 2023-03-09 ENCOUNTER — Other Ambulatory Visit (HOSPITAL_COMMUNITY): Payer: Self-pay

## 2023-03-21 ENCOUNTER — Other Ambulatory Visit (HOSPITAL_COMMUNITY): Payer: Self-pay

## 2023-03-31 DIAGNOSIS — G4733 Obstructive sleep apnea (adult) (pediatric): Secondary | ICD-10-CM | POA: Diagnosis not present

## 2023-04-03 ENCOUNTER — Other Ambulatory Visit: Payer: Self-pay

## 2023-04-03 ENCOUNTER — Other Ambulatory Visit (HOSPITAL_COMMUNITY): Payer: Self-pay

## 2023-04-10 ENCOUNTER — Other Ambulatory Visit (HOSPITAL_COMMUNITY): Payer: Self-pay

## 2023-05-01 DIAGNOSIS — G4733 Obstructive sleep apnea (adult) (pediatric): Secondary | ICD-10-CM | POA: Diagnosis not present

## 2023-05-02 ENCOUNTER — Other Ambulatory Visit (HOSPITAL_COMMUNITY): Payer: Self-pay

## 2023-05-03 ENCOUNTER — Other Ambulatory Visit (HOSPITAL_COMMUNITY): Payer: Self-pay

## 2023-05-03 MED ORDER — DEXCOM G6 SENSOR MISC
2 refills | Status: DC
  Filled 2023-05-03: qty 3, 30d supply, fill #0
  Filled 2023-05-25 – 2023-05-28 (×2): qty 3, 30d supply, fill #1
  Filled 2023-06-18 – 2023-06-21 (×2): qty 3, 30d supply, fill #2

## 2023-05-03 MED ORDER — METFORMIN HCL 1000 MG PO TABS
1000.0000 mg | ORAL_TABLET | Freq: Two times a day (BID) | ORAL | 1 refills | Status: DC
  Filled 2023-05-03: qty 180, 90d supply, fill #0
  Filled 2023-12-22: qty 180, 90d supply, fill #1

## 2023-05-03 MED ORDER — LOSARTAN POTASSIUM-HCTZ 100-25 MG PO TABS
1.0000 | ORAL_TABLET | Freq: Every day | ORAL | 0 refills | Status: DC
  Filled 2023-05-03: qty 90, 90d supply, fill #0

## 2023-05-04 ENCOUNTER — Other Ambulatory Visit (HOSPITAL_COMMUNITY): Payer: Self-pay

## 2023-05-04 DIAGNOSIS — Z125 Encounter for screening for malignant neoplasm of prostate: Secondary | ICD-10-CM | POA: Diagnosis not present

## 2023-05-04 DIAGNOSIS — E7800 Pure hypercholesterolemia, unspecified: Secondary | ICD-10-CM | POA: Diagnosis not present

## 2023-05-04 DIAGNOSIS — I10 Essential (primary) hypertension: Secondary | ICD-10-CM | POA: Diagnosis not present

## 2023-05-04 DIAGNOSIS — M109 Gout, unspecified: Secondary | ICD-10-CM | POA: Diagnosis not present

## 2023-05-04 DIAGNOSIS — G4733 Obstructive sleep apnea (adult) (pediatric): Secondary | ICD-10-CM | POA: Diagnosis not present

## 2023-05-04 DIAGNOSIS — K219 Gastro-esophageal reflux disease without esophagitis: Secondary | ICD-10-CM | POA: Diagnosis not present

## 2023-05-04 DIAGNOSIS — E1169 Type 2 diabetes mellitus with other specified complication: Secondary | ICD-10-CM | POA: Diagnosis not present

## 2023-05-04 DIAGNOSIS — Z0000 Encounter for general adult medical examination without abnormal findings: Secondary | ICD-10-CM | POA: Diagnosis not present

## 2023-05-04 DIAGNOSIS — E1122 Type 2 diabetes mellitus with diabetic chronic kidney disease: Secondary | ICD-10-CM | POA: Diagnosis not present

## 2023-05-04 MED ORDER — TRULICITY 3 MG/0.5ML ~~LOC~~ SOAJ
3.0000 mg | SUBCUTANEOUS | 1 refills | Status: AC
  Filled 2023-05-04: qty 2, 28d supply, fill #0
  Filled 2023-06-18: qty 2, 28d supply, fill #1
  Filled 2023-07-23: qty 2, 28d supply, fill #2
  Filled 2023-09-18: qty 2, 28d supply, fill #3

## 2023-05-08 ENCOUNTER — Other Ambulatory Visit (HOSPITAL_COMMUNITY): Payer: Self-pay

## 2023-05-15 ENCOUNTER — Other Ambulatory Visit (HOSPITAL_COMMUNITY): Payer: Self-pay

## 2023-05-25 ENCOUNTER — Other Ambulatory Visit (HOSPITAL_COMMUNITY): Payer: Self-pay

## 2023-05-28 ENCOUNTER — Other Ambulatory Visit (HOSPITAL_COMMUNITY): Payer: Self-pay

## 2023-06-01 DIAGNOSIS — G4733 Obstructive sleep apnea (adult) (pediatric): Secondary | ICD-10-CM | POA: Diagnosis not present

## 2023-06-18 ENCOUNTER — Other Ambulatory Visit (HOSPITAL_COMMUNITY): Payer: Self-pay

## 2023-06-18 ENCOUNTER — Other Ambulatory Visit: Payer: Self-pay

## 2023-06-21 ENCOUNTER — Other Ambulatory Visit (HOSPITAL_COMMUNITY): Payer: Self-pay

## 2023-06-29 DIAGNOSIS — G4733 Obstructive sleep apnea (adult) (pediatric): Secondary | ICD-10-CM | POA: Diagnosis not present

## 2023-07-23 ENCOUNTER — Other Ambulatory Visit: Payer: Self-pay

## 2023-07-23 ENCOUNTER — Other Ambulatory Visit (HOSPITAL_COMMUNITY): Payer: Self-pay

## 2023-07-23 DIAGNOSIS — M25652 Stiffness of left hip, not elsewhere classified: Secondary | ICD-10-CM | POA: Diagnosis not present

## 2023-07-23 DIAGNOSIS — M5459 Other low back pain: Secondary | ICD-10-CM | POA: Diagnosis not present

## 2023-07-23 DIAGNOSIS — M9905 Segmental and somatic dysfunction of pelvic region: Secondary | ICD-10-CM | POA: Diagnosis not present

## 2023-07-23 DIAGNOSIS — M9904 Segmental and somatic dysfunction of sacral region: Secondary | ICD-10-CM | POA: Diagnosis not present

## 2023-07-23 DIAGNOSIS — M7918 Myalgia, other site: Secondary | ICD-10-CM | POA: Diagnosis not present

## 2023-07-23 DIAGNOSIS — M25552 Pain in left hip: Secondary | ICD-10-CM | POA: Diagnosis not present

## 2023-07-23 DIAGNOSIS — M9903 Segmental and somatic dysfunction of lumbar region: Secondary | ICD-10-CM | POA: Diagnosis not present

## 2023-07-23 MED ORDER — DEXCOM G6 TRANSMITTER MISC
2 refills | Status: AC
  Filled 2023-07-23: qty 1, 90d supply, fill #0
  Filled 2023-11-29: qty 1, 90d supply, fill #1
  Filled 2024-02-28: qty 1, 90d supply, fill #2

## 2023-07-26 ENCOUNTER — Other Ambulatory Visit (HOSPITAL_COMMUNITY): Payer: Self-pay

## 2023-07-26 DIAGNOSIS — E1142 Type 2 diabetes mellitus with diabetic polyneuropathy: Secondary | ICD-10-CM | POA: Diagnosis not present

## 2023-07-26 DIAGNOSIS — M25512 Pain in left shoulder: Secondary | ICD-10-CM | POA: Diagnosis not present

## 2023-07-26 MED ORDER — DICLOFENAC SODIUM 50 MG PO TBEC
50.0000 mg | DELAYED_RELEASE_TABLET | Freq: Two times a day (BID) | ORAL | 0 refills | Status: AC
  Filled 2023-07-26: qty 40, 20d supply, fill #0

## 2023-07-30 DIAGNOSIS — G4733 Obstructive sleep apnea (adult) (pediatric): Secondary | ICD-10-CM | POA: Diagnosis not present

## 2023-08-02 ENCOUNTER — Other Ambulatory Visit (HOSPITAL_COMMUNITY): Payer: Self-pay

## 2023-08-02 DIAGNOSIS — E1169 Type 2 diabetes mellitus with other specified complication: Secondary | ICD-10-CM | POA: Diagnosis not present

## 2023-08-02 MED ORDER — TRULICITY 4.5 MG/0.5ML ~~LOC~~ SOAJ
4.5000 mg | SUBCUTANEOUS | 1 refills | Status: AC
  Filled 2023-08-02: qty 6, 84d supply, fill #0
  Filled 2023-11-06: qty 2, 28d supply, fill #0
  Filled 2023-11-29 – 2023-11-30 (×2): qty 2, 28d supply, fill #1
  Filled 2024-01-22: qty 2, 28d supply, fill #2
  Filled 2024-02-28: qty 2, 28d supply, fill #3
  Filled 2024-04-08: qty 2, 28d supply, fill #4
  Filled 2024-06-03: qty 2, 28d supply, fill #5

## 2023-08-05 ENCOUNTER — Other Ambulatory Visit (HOSPITAL_COMMUNITY): Payer: Self-pay

## 2023-08-06 ENCOUNTER — Other Ambulatory Visit (HOSPITAL_COMMUNITY): Payer: Self-pay

## 2023-08-06 ENCOUNTER — Other Ambulatory Visit: Payer: Self-pay

## 2023-08-06 MED ORDER — LOSARTAN POTASSIUM-HCTZ 100-25 MG PO TABS
1.0000 | ORAL_TABLET | Freq: Every day | ORAL | 0 refills | Status: DC
Start: 2023-08-06 — End: 2024-01-22
  Filled 2023-08-06: qty 90, 90d supply, fill #0

## 2023-08-15 ENCOUNTER — Other Ambulatory Visit (HOSPITAL_COMMUNITY): Payer: Self-pay

## 2023-08-16 ENCOUNTER — Other Ambulatory Visit (HOSPITAL_COMMUNITY): Payer: Self-pay

## 2023-08-16 MED ORDER — GABAPENTIN 100 MG PO CAPS
100.0000 mg | ORAL_CAPSULE | ORAL | 0 refills | Status: AC
  Filled 2023-08-16: qty 180, 36d supply, fill #0

## 2023-08-23 DIAGNOSIS — G4733 Obstructive sleep apnea (adult) (pediatric): Secondary | ICD-10-CM | POA: Diagnosis not present

## 2023-09-18 DIAGNOSIS — M25512 Pain in left shoulder: Secondary | ICD-10-CM | POA: Diagnosis not present

## 2023-09-18 DIAGNOSIS — E1169 Type 2 diabetes mellitus with other specified complication: Secondary | ICD-10-CM | POA: Diagnosis not present

## 2023-09-19 ENCOUNTER — Other Ambulatory Visit: Payer: Self-pay

## 2023-10-02 ENCOUNTER — Other Ambulatory Visit (HOSPITAL_COMMUNITY): Payer: Self-pay

## 2023-10-03 ENCOUNTER — Other Ambulatory Visit: Payer: Self-pay

## 2023-10-03 ENCOUNTER — Ambulatory Visit: Attending: Sports Medicine | Admitting: Physical Therapy

## 2023-10-03 ENCOUNTER — Encounter: Payer: Self-pay | Admitting: Physical Therapy

## 2023-10-03 DIAGNOSIS — M6281 Muscle weakness (generalized): Secondary | ICD-10-CM | POA: Insufficient documentation

## 2023-10-03 DIAGNOSIS — M25512 Pain in left shoulder: Secondary | ICD-10-CM | POA: Insufficient documentation

## 2023-10-03 NOTE — Therapy (Signed)
 OUTPATIENT PHYSICAL THERAPY SHOULDER EVALUATION   Patient Name: Kerry Nelson MRN: [id number] DOB:09-08-1965, 58 y.o., male Today's Date: 10/04/2023   PT End of Session - 10/04/23 1255     Visit Number 1    Number of Visits --   1-2x/week   Date for PT Re-Evaluation 11/29/23    Authorization Type [employer] Employee - [employer]    PT Start Time 1500    PT Stop Time 1545    PT Time Calculation (min) 45 min             Past Medical History:  Diagnosis Date   Chest pain    Diabetes mellitus without complication (HCC)    GERD (gastroesophageal reflux disease)    Hyperlipidemia    Hypertension    Prediabetes    Sleep apnea 2010   does not use Cpap   Past Surgical History:  Procedure Laterality Date   CARDIAC CATHETERIZATION  2010   Dr Katheryne Pane    CORRECTION HAMMER TOE Left    FOOT SURGERY     bone spur removal   HERNIA REPAIR     LIPOMA EXCISION     x2   SHOULDER ARTHROSCOPY WITH DISTAL CLAVICLE RESECTION Left 09/17/2012   Procedure: SHOULDER ARTHROSCOPY WITH DISTAL CLAVICLE RESECTION;  Surgeon: Shirlee Dotter, MD;  Location: [facility] OR;  Service: Orthopedics;  Laterality: Left;  Left shoulder arthroscopy, subacromial decompression, distal clavicle resection.   UMBILICAL HERNIA REPAIR     Patient Active Problem List   Diagnosis Date Noted   High cholesterol 08/28/2022   Hypertension 08/28/2022   Gout 08/28/2022   Sleep apnea 08/28/2022   Osteoarthritis 08/28/2022   Left hip pain 06/19/2016   ELBOW PAIN, LEFT 08/05/2009   SHOULDER IMPINGEMENT SYNDROME 08/05/2009   Lateral epicondylitis 08/05/2009   ANKLE PAIN, LEFT 02/10/2009   OTHER ACQUIRED DEFORMITY OF ANKLE AND FOOT OTHER 02/10/2009   Osteoarthritis of right knee 03/10/2008   KNEE PAIN 03/10/2008   UNEQUAL LEG LENGTH 03/10/2008    PCP: Diamond Formica, PA  REFERRING PROVIDER: Rance Burrows, MD  THERAPY DIAG:  Left shoulder pain, unspecified chronicity - Plan: PT plan of care cert/re-cert  Muscle  weakness - Plan: PT plan of care cert/re-cert  REFERRING DIAG: Pain in left shoulder [M25.512]   Rationale for Evaluation and Treatment:  Rehabilitation  SUBJECTIVE:  PERTINENT PAST HISTORY:  HTN, OSA, gout (no episode in years)      PRECAUTIONS: None  WEIGHT BEARING RESTRICTIONS No  FALLS:  Has patient fallen in last 6 months? No, Number of falls: 0  MOI/History of condition:  Onset date: Chronic with inreasing pain over the last several months  SUBJECTIVE STATEMENT  Kerry Nelson is a 58 y.o. male who presents to clinic with chief complaint of L shoulder pain which is chronic for several years but has increased in intensity over the last few months.  He has been completing HIT workouts since 2001 but OH movements are now causing more pain. He also has periods when his shoulder will lock up either with OH or IR movement.  Denies radicular sxs.  Denies significant clicking and popping.   Red flags:  denies   Pain:  Are you having pain? Yes Pain location: Posterior shoulder NPRS scale:  0/10 to 6/10 Aggravating factors: reaching, particularly with abd Relieving factors: rest Pain description: aching Stage: Chronic 24 hour pattern: worse with activity   Occupation: Designer, industrial/product job - sedentary  Assistive Device: na  Hand Dominance: R  Patient Goals/Specific Activities: pain, dressing, working out   OBJECTIVE:   DIAGNOSTIC FINDINGS:  None in epic - X-ray taken pt reports some type of arthritis   GENERAL OBSERVATION: Fwd head, rounded shoulders     SENSATION: Light touch: Appears intact   PALPATION: TTP muscle belly of infraspinatus  UPPER EXTREMITY AROM:  ROM Right (Eval) Left (Eval)  Shoulder flexion 135 135*  Shoulder abduction 110* 110*  Shoulder internal rotation    Shoulder external rotation    Functional IR L2 sacrum  Functional ER T1 T1*  Shoulder extension    Elbow extension    Elbow flexion     (Blank rows = not tested, N = WNL, *  = concordant pain with testing)  UPPER EXTREMITY MMT:  MMT Right (Eval) Left (Eval)  Shoulder flexion 4+ 4+  Shoulder abduction (C5) 4+ 4*  Shoulder ER 5 5  Shoulder IR 5 5  Middle trapezius    Lower trapezius    Shoulder extension    Grip strength    Cervical flexion (C1,C2)    Cervical S/B (C3)    Shoulder shrug (C4)    Elbow flexion (C6)    Elbow ext (C7)    Thumb ext (C8)    Finger abd (T1)    Grossly     (Blank rows = not tested, score listed is out of 5 possible points.  N = WNL, D = diminished, C = clear for gross weakness with myotome testing, * = concordant pain with testing)  UPPER EXTREMITY PROM:  PROM Right (Eval) Left (Eval)  Shoulder flexion  = AROM with pain at end range  Shoulder abduction    Shoulder internal rotation    Shoulder external rotation    Functional IR    Functional ER    Shoulder extension    Elbow extension    Elbow flexion     (Blank rows = not tested, N = WNL, * = concordant pain with testing)  SPECIAL TESTS: R/C test cluster (+)  JOINT MOBILITY TESTING:  Hypomobile bil   PATIENT SURVEYS:  Quick Dash: QuickDASH Score: 36.4 / 100 = 36.4 %    TODAY'S TREATMENT:  Therapeutic Exercise: Creating, reviewing, and completing below HEP   PATIENT EDUCATION ([place]/[facility]):  POC, diagnosis, prognosis, HEP, and outcome measures.  Pt educated via explanation, demonstration, and handout (HEP).  Pt confirms understanding verbally.   HOME EXERCISE PROGRAM: Access Code: [email or web address] URL: [email or web address] Date: 10/03/2023 Prepared by: Lesleigh Rash  Exercises - Sidelying Shoulder Flexion 15 Degrees  - 1 x daily - 4-7 x weekly - 3 sets - 15 reps - Sidelying Shoulder Horizontal Abduction  - 1 x daily - 4-7 x weekly - 3 sets - 15 reps - Shoulder External Rotation Reactive Isometrics  - 1-2 x daily - 7 x weekly - 2 sets - 10 reps  Treatment priorities   Eval        Thoracic mobility        Progressive R/C strengthening         Activity modification and avoiding agging HIT workouts                          ASSESSMENT:  CLINICAL IMPRESSION: Mihir is a 58 y.o. male who presents to clinic with signs and sxs consistent with chronic L shoulder pain which has been present at a lesser level for more than a year but has increased in severity over the last 3  months.  Most consistent with SAPS likely exacerbated by HIT workouts involving repetitive heavy OH lifting.  Eder will benefit from skilled PT to address relevant deficits and improve comfort with daily tasks and recreation..    OBJECTIVE IMPAIRMENTS: Pain, shoulder ROM, shoulder strength  ACTIVITY LIMITATIONS: reaching, workouts, driving, lifting  PERSONAL FACTORS: See medical history and pertinent history   REHAB POTENTIAL: Good  CLINICAL DECISION MAKING: Evolving/moderate complexity  EVALUATION COMPLEXITY: Moderate   GOALS:   SHORT TERM GOALS: Target date: 11/01/2023  Gustave will be >75% HEP compliant to improve carryover between sessions and facilitate independent management of condition  Evaluation: ongoing Goal status: INITIAL   LONG TERM GOALS: Target date: 11/29/2023   Philo will self report >/= 50% decrease in pain from evaluation to improve function in daily tasks  Evaluation/Baseline: 6/10 max pain Goal status: INITIAL   2.  Xion will be able to complete workouts, possibly with some modification, not limited by pain  Evaluation/Baseline: limited Goal status: INITIAL   3.  Darrik will show a >/= 20 pct improvement in his QUICK DASH score (MCID is 10% or ~5 pts) as a proxy for functional improvement   Evaluation/Baseline: QuickDASH Score: 36.4 / 100 = 36.4 % Goal status: INITIAL   4.  Baker will be able to reach behind back for dressing tasks, not limited by pain  Evaluation/Baseline: limited Goal status: INITIAL   5.  Kiley will report no instances on his shoulder "locking"  Evaluation/Baseline: several  instance of shoulder "locking up" Goal status: INITIAL   PLAN: PT FREQUENCY: 1-2x/week  PT DURATION: 8 weeks  PLANNED INTERVENTIONS:  97164- PT Re-evaluation, 97110-Therapeutic exercises, 97530- Therapeutic activity, [id number]- Neuromuscular re-education, 97535- Self Care, [id number]- Manual therapy, [id number]- Gait training, [id number]- Aquatic Therapy, [phone]- Electrical stimulation (manual), [id number]- Vasopneumatic device, [id number]- Traction (mechanical), [id number]- Ionotophoresis 4mg /ml Dexamethasone, Taping, Dry Needling, Joint manipulation, and Spinal manipulation.   Mackenna Kamer PT, DPT 10/04/2023, 1:20 PM

## 2023-10-05 ENCOUNTER — Encounter (HOSPITAL_COMMUNITY): Payer: Self-pay

## 2023-10-05 ENCOUNTER — Other Ambulatory Visit (HOSPITAL_COMMUNITY): Payer: Self-pay

## 2023-10-09 ENCOUNTER — Ambulatory Visit: Payer: Self-pay | Admitting: Physical Therapy

## 2023-10-09 ENCOUNTER — Encounter: Payer: Self-pay | Admitting: Physical Therapy

## 2023-10-09 DIAGNOSIS — M25512 Pain in left shoulder: Secondary | ICD-10-CM | POA: Diagnosis not present

## 2023-10-09 DIAGNOSIS — M6281 Muscle weakness (generalized): Secondary | ICD-10-CM

## 2023-10-09 NOTE — Therapy (Signed)
 OUTPATIENT PHYSICAL THERAPY DAILY NOTE   Patient Name: Kerry Nelson MRN: [id number] DOB:12/07/1965, 58 y.o., male Today's Date: 10/09/2023   PT End of Session - 10/09/23 0930     Visit Number 2    Number of Visits --   1-2x/week   Date for PT Re-Evaluation 11/29/23    Authorization Type [employer] Employee - [employer]    PT Start Time 0930    PT Stop Time 1011    PT Time Calculation (min) 41 min              Past Medical History:  Diagnosis Date   Chest pain    Diabetes mellitus without complication (HCC)    GERD (gastroesophageal reflux disease)    Hyperlipidemia    Hypertension    Prediabetes    Sleep apnea 2010   does not use Cpap   Past Surgical History:  Procedure Laterality Date   CARDIAC CATHETERIZATION  2010   Dr Katheryne Pane    CORRECTION HAMMER TOE Left    FOOT SURGERY     bone spur removal   HERNIA REPAIR     LIPOMA EXCISION     x2   SHOULDER ARTHROSCOPY WITH DISTAL CLAVICLE RESECTION Left 09/17/2012   Procedure: SHOULDER ARTHROSCOPY WITH DISTAL CLAVICLE RESECTION;  Surgeon: Shirlee Dotter, MD;  Location: [facility] OR;  Service: Orthopedics;  Laterality: Left;  Left shoulder arthroscopy, subacromial decompression, distal clavicle resection.   UMBILICAL HERNIA REPAIR     Patient Active Problem List   Diagnosis Date Noted   High cholesterol 08/28/2022   Hypertension 08/28/2022   Gout 08/28/2022   Sleep apnea 08/28/2022   Osteoarthritis 08/28/2022   Left hip pain 06/19/2016   ELBOW PAIN, LEFT 08/05/2009   SHOULDER IMPINGEMENT SYNDROME 08/05/2009   Lateral epicondylitis 08/05/2009   ANKLE PAIN, LEFT 02/10/2009   OTHER ACQUIRED DEFORMITY OF ANKLE AND FOOT OTHER 02/10/2009   Osteoarthritis of right knee 03/10/2008   KNEE PAIN 03/10/2008   UNEQUAL LEG LENGTH 03/10/2008    PCP: Diamond Formica, PA  REFERRING PROVIDER: Rance Burrows, MD  THERAPY DIAG:  Left shoulder pain, unspecified chronicity  Muscle weakness  REFERRING DIAG: Pain in left  shoulder [M25.512]   Rationale for Evaluation and Treatment:  Rehabilitation  SUBJECTIVE:  PERTINENT PAST HISTORY:  HTN, OSA, gout (no episode in years)      PRECAUTIONS: None  WEIGHT BEARING RESTRICTIONS No  FALLS:  Has patient fallen in last 6 months? No, Number of falls: 0  MOI/History of condition:  Onset date: Chronic with inreasing pain over the last several months  SUBJECTIVE STATEMENT  10/09/2023:  Pt reports that he has been very busy and he has not been able to complete his HEP.  He did some heavy lifting over the weekend and his shoulder sxs are a bit exacerbated.  He has had one instance of the shoulder locking up.    EVAL: Kerry Nelson is a 58 y.o. male who presents to clinic with chief complaint of L shoulder pain which is chronic for several years but has increased in intensity over the last few months.  He has been completing HIT workouts since 2001 but OH movements are now causing more pain. He also has periods when his shoulder will lock up either with OH or IR movement.  Denies radicular sxs.  Denies significant clicking and popping.   Red flags:  denies   Pain:  Are you having pain? Yes Pain location: L posterior shoulder NPRS  scale:  7/10 Aggravating factors: reaching, particularly with abd Relieving factors: rest Pain description: aching Stage: Chronic 24 hour pattern: worse with activity   Occupation: Designer, industrial/product job - sedentary  Assistive Device: na  Hand Dominance: R  Patient Goals/Specific Activities: pain, dressing, working out   OBJECTIVE:   DIAGNOSTIC FINDINGS:  None in epic - X-ray taken pt reports some type of arthritis   GENERAL OBSERVATION: Fwd head, rounded shoulders     SENSATION: Light touch: Appears intact   PALPATION: TTP muscle belly of infraspinatus  UPPER EXTREMITY AROM:  ROM Right (Eval) Left (Eval)  Shoulder flexion 135 135*  Shoulder abduction 110* 110*  Shoulder internal rotation    Shoulder  external rotation    Functional IR L2 sacrum  Functional ER T1 T1*  Shoulder extension    Elbow extension    Elbow flexion     (Blank rows = not tested, N = WNL, * = concordant pain with testing)  UPPER EXTREMITY MMT:  MMT Right (Eval) Left (Eval)  Shoulder flexion 4+ 4+  Shoulder abduction (C5) 4+ 4*  Shoulder ER 5 5  Shoulder IR 5 5  Middle trapezius    Lower trapezius    Shoulder extension    Grip strength    Cervical flexion (C1,C2)    Cervical S/B (C3)    Shoulder shrug (C4)    Elbow flexion (C6)    Elbow ext (C7)    Thumb ext (C8)    Finger abd (T1)    Grossly     (Blank rows = not tested, score listed is out of 5 possible points.  N = WNL, D = diminished, C = clear for gross weakness with myotome testing, * = concordant pain with testing)  UPPER EXTREMITY PROM:  PROM Right (Eval) Left (Eval)  Shoulder flexion  = AROM with pain at end range  Shoulder abduction    Shoulder internal rotation    Shoulder external rotation    Functional IR    Functional ER    Shoulder extension    Elbow extension    Elbow flexion     (Blank rows = not tested, N = WNL, * = concordant pain with testing)  SPECIAL TESTS: R/C test cluster (+)  JOINT MOBILITY TESTING:  Hypomobile bil   PATIENT SURVEYS:  Quick Dash: QuickDASH Score: 36.4 / 100 = 36.4 %    TODAY'S TREATMENT:   [facility] Adult PT Treatment  10/09/2023:  Therapeutic Exercise:  UBE 2.5'/2.5' Single arm row - 10# - 3x15 Standing shoulder ER RTB Wall slide - 10x  Neuromuscular re-ed: Rhythmic stabilization at 90 - 3x1' D2 flexion manual resistance - 3x to fatigue  Manual Therapy  AP and inferior GH joint mobs  HOME EXERCISE PROGRAM: Access Code: [email or web address] URL: [email or web address] Date: 10/03/2023 Prepared by: Lesleigh Rash  Exercises - Sidelying Shoulder Flexion 15 Degrees  - 1 x daily - 4-7 x weekly - 3 sets - 15 reps - Sidelying Shoulder Horizontal Abduction  - 1 x daily - 4-7  x weekly - 3 sets - 15 reps - Shoulder External Rotation Reactive Isometrics  - 1-2 x daily - 7 x weekly - 2 sets - 10 reps  Treatment priorities   Eval        Thoracic mobility        Progressive R/C strengthening        Activity modification and avoiding agging HIT workouts  ASSESSMENT:  CLINICAL IMPRESSION:  10/09/2023:  Kerry Nelson tolerated session well with no adverse reaction.  Concentrated on joint mobility followed by stabilization and cuff strengthening.  Pt reports minimal increase in in pain with exercises but significant fatigue.  Discussed importance of HEP and goal for early rehab.  EVAL:  Kerry Nelson is a 58 y.o. male who presents to clinic with signs and sxs consistent with chronic L shoulder pain which has been present at a lesser level for more than a year but has increased in severity over the last 3 months.  Most consistent with SAPS likely exacerbated by HIT workouts involving repetitive heavy OH lifting.  Kerry Nelson will benefit from skilled PT to address relevant deficits and improve comfort with daily tasks and recreation..    OBJECTIVE IMPAIRMENTS: Pain, shoulder ROM, shoulder strength  ACTIVITY LIMITATIONS: reaching, workouts, driving, lifting  PERSONAL FACTORS: See medical history and pertinent history   REHAB POTENTIAL: Good  CLINICAL DECISION MAKING: Evolving/moderate complexity  EVALUATION COMPLEXITY: Moderate   GOALS:   SHORT TERM GOALS: Target date: 11/01/2023  Kerry Nelson will be >75% HEP compliant to improve carryover between sessions and facilitate independent management of condition  Evaluation: ongoing Goal status: INITIAL   LONG TERM GOALS: Target date: 11/29/2023   Kerry Nelson will self report >/= 50% decrease in pain from evaluation to improve function in daily tasks  Evaluation/Baseline: 6/10 max pain Goal status: INITIAL   2.  Kerry Nelson will be able to complete workouts, possibly with some modification, not limited by  pain  Evaluation/Baseline: limited Goal status: INITIAL   3.  Kerry Nelson will show a >/= 20 pct improvement in his QUICK DASH score (MCID is 10% or ~5 pts) as a proxy for functional improvement   Evaluation/Baseline: QuickDASH Score: 36.4 / 100 = 36.4 % Goal status: INITIAL   4.  Kerry Nelson will be able to reach behind back for dressing tasks, not limited by pain  Evaluation/Baseline: limited Goal status: INITIAL   5.  Kerry Nelson will report no instances on his shoulder "locking"  Evaluation/Baseline: several instance of shoulder "locking up" Goal status: INITIAL   PLAN: PT FREQUENCY: 1-2x/week  PT DURATION: 8 weeks  PLANNED INTERVENTIONS:  97164- PT Re-evaluation, 97110-Therapeutic exercises, 97530- Therapeutic activity, [id number]- Neuromuscular re-education, 97535- Self Care, [id number]- Manual therapy, [id number]- Gait training, [id number]- Aquatic Therapy, [phone]- Electrical stimulation (manual), [id number]- Vasopneumatic device, [id number]- Traction (mechanical), [id number]- Ionotophoresis 4mg /ml Dexamethasone, Taping, Dry Needling, Joint manipulation, and Spinal manipulation.   Tykira Wachs PT, DPT 10/09/2023, 10:15 AM

## 2023-10-12 ENCOUNTER — Encounter: Payer: Self-pay | Admitting: Physical Therapy

## 2023-10-12 ENCOUNTER — Ambulatory Visit: Admitting: Physical Therapy

## 2023-10-12 DIAGNOSIS — M6281 Muscle weakness (generalized): Secondary | ICD-10-CM

## 2023-10-12 DIAGNOSIS — M25512 Pain in left shoulder: Secondary | ICD-10-CM

## 2023-10-12 NOTE — Therapy (Signed)
 OUTPATIENT PHYSICAL THERAPY DAILY NOTE   Patient Name: Kerry Nelson MRN: [id number] DOB:15-Mar-1966, 58 y.o., male Today's Date: 10/12/2023   PT End of Session - 10/12/23 0926     Visit Number 3    Number of Visits --   1-2x/week   Date for PT Re-Evaluation 11/29/23    Authorization Type [employer] Employee - Quick [employer]    PT Start Time 0930    PT Stop Time 1012    PT Time Calculation (min) 42 min           Past Medical History:  Diagnosis Date   Chest pain    Diabetes mellitus without complication (HCC)    GERD (gastroesophageal reflux disease)    Hyperlipidemia    Hypertension    Prediabetes    Sleep apnea 2010   does not use Cpap   Past Surgical History:  Procedure Laterality Date   CARDIAC CATHETERIZATION  2010   Dr Katheryne Pane    CORRECTION HAMMER TOE Left    FOOT SURGERY     bone spur removal   HERNIA REPAIR     LIPOMA EXCISION     x2   SHOULDER ARTHROSCOPY WITH DISTAL CLAVICLE RESECTION Left 09/17/2012   Procedure: SHOULDER ARTHROSCOPY WITH DISTAL CLAVICLE RESECTION;  Surgeon: Shirlee Dotter, MD;  Location: [facility] OR;  Service: Orthopedics;  Laterality: Left;  Left shoulder arthroscopy, subacromial decompression, distal clavicle resection.   UMBILICAL HERNIA REPAIR     Patient Active Problem List   Diagnosis Date Noted   High cholesterol 08/28/2022   Hypertension 08/28/2022   Gout 08/28/2022   Sleep apnea 08/28/2022   Osteoarthritis 08/28/2022   Left hip pain 06/19/2016   ELBOW PAIN, LEFT 08/05/2009   SHOULDER IMPINGEMENT SYNDROME 08/05/2009   Lateral epicondylitis 08/05/2009   ANKLE PAIN, LEFT 02/10/2009   OTHER ACQUIRED DEFORMITY OF ANKLE AND FOOT OTHER 02/10/2009   Osteoarthritis of right knee 03/10/2008   KNEE PAIN 03/10/2008   UNEQUAL LEG LENGTH 03/10/2008    PCP: Diamond Formica, PA  REFERRING PROVIDER: Diamond Formica, PA  THERAPY DIAG:  Left shoulder pain, unspecified chronicity  Muscle weakness  REFERRING DIAG: Pain in left shoulder  [M25.512]   Rationale for Evaluation and Treatment:  Rehabilitation  SUBJECTIVE:  PERTINENT PAST HISTORY:  HTN, OSA, gout (no episode in years)      PRECAUTIONS: None  WEIGHT BEARING RESTRICTIONS No  FALLS:  Has patient fallen in last 6 months? No, Number of falls: 0  MOI/History of condition:  Onset date: Chronic with inreasing pain over the last several months  SUBJECTIVE STATEMENT  10/12/2023:  Pt reports that that overall his pain level has reduced since he stopped doing more intense exercise.  When he reaches behind his back he is getting locking about 80% of the time.    EVAL: Kerry Nelson is a 58 y.o. male who presents to clinic with chief complaint of L shoulder pain which is chronic for several years but has increased in intensity over the last few months.  He has been completing HIT workouts since 2001 but OH movements are now causing more pain. He also has periods when his shoulder will lock up either with OH or IR movement.  Denies radicular sxs.  Denies significant clicking and popping.   Red flags:  denies   Pain:  Are you having pain? Yes Pain location: L posterior shoulder NPRS scale:  7/10 Aggravating factors: reaching, particularly with abd Relieving factors: rest Pain description: aching Stage:  Chronic 24 hour pattern: worse with activity   Occupation: Designer, industrial/product job - sedentary  Assistive Device: na  Hand Dominance: R  Patient Goals/Specific Activities: pain, dressing, working out   OBJECTIVE:   DIAGNOSTIC FINDINGS:  None in epic - X-ray taken pt reports some type of arthritis   GENERAL OBSERVATION: Fwd head, rounded shoulders     SENSATION: Light touch: Appears intact   PALPATION: TTP muscle belly of infraspinatus  UPPER EXTREMITY AROM:  ROM Right (Eval) Left (Eval)  Shoulder flexion 135 135*  Shoulder abduction 110* 110*  Shoulder internal rotation    Shoulder external rotation    Functional IR L2 sacrum  Functional ER  T1 T1*  Shoulder extension    Elbow extension    Elbow flexion     (Blank rows = not tested, N = WNL, * = concordant pain with testing)  UPPER EXTREMITY MMT:  MMT Right (Eval) Left (Eval)  Shoulder flexion 4+ 4+  Shoulder abduction (C5) 4+ 4*  Shoulder ER 5 5  Shoulder IR 5 5  Middle trapezius    Lower trapezius    Shoulder extension    Grip strength    Cervical flexion (C1,C2)    Cervical S/B (C3)    Shoulder shrug (C4)    Elbow flexion (C6)    Elbow ext (C7)    Thumb ext (C8)    Finger abd (T1)    Grossly     (Blank rows = not tested, score listed is out of 5 possible points.  N = WNL, D = diminished, C = clear for gross weakness with myotome testing, * = concordant pain with testing)  UPPER EXTREMITY PROM:  PROM Right (Eval) Left (Eval)  Shoulder flexion  = AROM with pain at end range  Shoulder abduction    Shoulder internal rotation    Shoulder external rotation    Functional IR    Functional ER    Shoulder extension    Elbow extension    Elbow flexion     (Blank rows = not tested, N = WNL, * = concordant pain with testing)  SPECIAL TESTS: R/C test cluster (+)  JOINT MOBILITY TESTING:  Hypomobile bil   PATIENT SURVEYS:  Quick Dash: QuickDASH Score: 36.4 / 100 = 36.4 %    TODAY'S TREATMENT:   [facility] Adult PT Treatment  10/09/2023:  Therapeutic Exercise:  UBE 3'/3' - L3 Chest press - 6# ea - 2x15 Supine non-painful arc - 3# x15 S/L shoulder abd - short lever arm - 2x15 Supine SA circles - 3# 1x, 6# 1x - 20 circles CW/CC  Single arm row - 10# - 3x15 Standing shoulder ER RTB Wall slide - 10x Fwd OH press - 0# Bil ER RTB - 2x10  Neuromuscular re-ed: Rhythmic stabilization at 100 d - 3x1' D2 flexion manual resistance - 3x to fatigue   HOME EXERCISE PROGRAM: Access Code: [email or web address] URL: [email or web address] Date: 10/12/2023 Prepared by: Lesleigh Rash  Exercises - Shoulder External Rotation and Scapular Retraction with  Resistance  - 1 x daily - 7 x weekly - 3 sets - 10 reps - Seated Overhead Press  - 1 x daily - 7 x weekly - 3 sets - 10 reps  Treatment priorities   Eval        Thoracic mobility        Progressive R/C strengthening        Activity modification and avoiding agging HIT workouts  ASSESSMENT:  CLINICAL IMPRESSION:  10/12/2023:  Dovie Gell tolerated session well with no adverse reaction.  Working progressing cuff and periscapular strengthening to good effect.  HEP updated.  EVAL:  Yonathan is a 58 y.o. male who presents to clinic with signs and sxs consistent with chronic L shoulder pain which has been present at a lesser level for more than a year but has increased in severity over the last 3 months.  Most consistent with SAPS likely exacerbated by HIT workouts involving repetitive heavy OH lifting.  Vinnie will benefit from skilled PT to address relevant deficits and improve comfort with daily tasks and recreation..    OBJECTIVE IMPAIRMENTS: Pain, shoulder ROM, shoulder strength  ACTIVITY LIMITATIONS: reaching, workouts, driving, lifting  PERSONAL FACTORS: See medical history and pertinent history   REHAB POTENTIAL: Good  CLINICAL DECISION MAKING: Evolving/moderate complexity  EVALUATION COMPLEXITY: Moderate   GOALS:   SHORT TERM GOALS: Target date: 11/01/2023  Draco will be >75% HEP compliant to improve carryover between sessions and facilitate independent management of condition  Evaluation: ongoing Goal status: INITIAL   LONG TERM GOALS: Target date: 11/29/2023   Marchello will self report >/= 50% decrease in pain from evaluation to improve function in daily tasks  Evaluation/Baseline: 6/10 max pain Goal status: INITIAL   2.  Lemonte will be able to complete workouts, possibly with some modification, not limited by pain  Evaluation/Baseline: limited Goal status: INITIAL   3.  Saadiq will show a >/= 20 pct improvement in his QUICK DASH score  (MCID is 10% or ~5 pts) as a proxy for functional improvement   Evaluation/Baseline: QuickDASH Score: 36.4 / 100 = 36.4 % Goal status: INITIAL   4.  Judah will be able to reach behind back for dressing tasks, not limited by pain  Evaluation/Baseline: limited Goal status: INITIAL   5.  Carmine will report no instances on his shoulder locking  Evaluation/Baseline: several instance of shoulder locking up Goal status: INITIAL   PLAN: PT FREQUENCY: 1-2x/week  PT DURATION: 8 weeks  PLANNED INTERVENTIONS:  97164- PT Re-evaluation, 97110-Therapeutic exercises, 97530- Therapeutic activity, [id number]- Neuromuscular re-education, 97535- Self Care, [id number]- Manual therapy, [id number]- Gait training, [id number]- Aquatic Therapy, [phone]- Electrical stimulation (manual), [id number]- Vasopneumatic device, [id number]- Traction (mechanical), [id number]- Ionotophoresis 4mg /ml Dexamethasone, Taping, Dry Needling, Joint manipulation, and Spinal manipulation.   Dashton Czerwinski PT, DPT 10/12/2023, 10:15 AM

## 2023-10-17 ENCOUNTER — Ambulatory Visit: Admitting: Physical Therapy

## 2023-10-19 ENCOUNTER — Ambulatory Visit: Admitting: Physical Therapy

## 2023-10-22 ENCOUNTER — Ambulatory Visit: Admitting: Physical Therapy

## 2023-10-22 ENCOUNTER — Encounter: Payer: Self-pay | Admitting: Physical Therapy

## 2023-10-22 DIAGNOSIS — M6281 Muscle weakness (generalized): Secondary | ICD-10-CM | POA: Diagnosis not present

## 2023-10-22 DIAGNOSIS — M25512 Pain in left shoulder: Secondary | ICD-10-CM

## 2023-10-22 NOTE — Therapy (Signed)
 OUTPATIENT PHYSICAL THERAPY DAILY NOTE   Patient Name: Kerry Nelson MRN: [id number] DOB:[birth year], 58 y.o., male Today's Date: 10/22/2023   PT End of Session - 10/22/23 0930     Visit Number 4    Number of Visits --   1-2x/week   Date for PT Re-Evaluation 11/29/23    Authorization Type [employer] Employee - Quick [employer]    PT Start Time 0930    PT Stop Time 1010    PT Time Calculation (min) 40 min           Past Medical History:  Diagnosis Date   Chest pain    Diabetes mellitus without complication (HCC)    GERD (gastroesophageal reflux disease)    Hyperlipidemia    Hypertension    Prediabetes    Sleep apnea 2010   does not use Cpap   Past Surgical History:  Procedure Laterality Date   CARDIAC CATHETERIZATION  2010   Dr Court    CORRECTION HAMMER TOE Left    FOOT SURGERY     bone spur removal   HERNIA REPAIR     LIPOMA EXCISION     x2   SHOULDER ARTHROSCOPY WITH DISTAL CLAVICLE RESECTION Left 09/17/2012   Procedure: SHOULDER ARTHROSCOPY WITH DISTAL CLAVICLE RESECTION;  Surgeon: Maude LELON Right, MD;  Location: [facility] OR;  Service: Orthopedics;  Laterality: Left;  Left shoulder arthroscopy, subacromial decompression, distal clavicle resection.   UMBILICAL HERNIA REPAIR     Patient Active Problem List   Diagnosis Date Noted   High cholesterol 08/28/2022   Hypertension 08/28/2022   Gout 08/28/2022   Sleep apnea 08/28/2022   Osteoarthritis 08/28/2022   Left hip pain 06/19/2016   ELBOW PAIN, LEFT 08/05/2009   SHOULDER IMPINGEMENT SYNDROME 08/05/2009   Lateral epicondylitis 08/05/2009   ANKLE PAIN, LEFT 02/10/2009   OTHER ACQUIRED DEFORMITY OF ANKLE AND FOOT OTHER 02/10/2009   Osteoarthritis of right knee 03/10/2008   KNEE PAIN 03/10/2008   UNEQUAL LEG LENGTH 03/10/2008    PCP: Alvera Reagin, PA  REFERRING PROVIDER: Alvera Reagin, PA  THERAPY DIAG:  Left shoulder pain, unspecified chronicity  Muscle weakness  REFERRING DIAG: Pain in left shoulder  [M25.512]   Rationale for Evaluation and Treatment:  Rehabilitation  SUBJECTIVE:  PERTINENT PAST HISTORY:  HTN, OSA, gout (no episode in years)      PRECAUTIONS: None  WEIGHT BEARING RESTRICTIONS No  FALLS:  Has patient fallen in last 6 months? No, Number of falls: 0  MOI/History of condition:  Onset date: Chronic with inreasing pain over the last several months  SUBJECTIVE STATEMENT  10/22/2023:  Pt reports no pain on arrival and compliance with initial HEP, no change as of yet.     EVAL: Kerry Nelson is a 58 y.o. male who presents to clinic with chief complaint of L shoulder pain which is chronic for several years but has increased in intensity over the last few months.  He has been completing HIT workouts since 2001 but OH movements are now causing more pain. He also has periods when his shoulder will lock up either with OH or IR movement.  Denies radicular sxs.  Denies significant clicking and popping.   Red flags:  denies   Pain:  Are you having pain? Yes Pain location: L posterior shoulder NPRS scale:  0/10 Aggravating factors: reaching, particularly with abd Relieving factors: rest Pain description: aching Stage: Chronic 24 hour pattern: worse with activity   Occupation: Designer, industrial/product job - sedentary  Assistive  Device: na  Hand Dominance: R  Patient Goals/Specific Activities: pain, dressing, working out   OBJECTIVE:   DIAGNOSTIC FINDINGS:  None in epic - X-ray taken pt reports some type of arthritis   GENERAL OBSERVATION: Fwd head, rounded shoulders     SENSATION: Light touch: Appears intact   PALPATION: TTP muscle belly of infraspinatus  UPPER EXTREMITY AROM:  ROM Right (Eval) Left (Eval) Right 10/22/23 Left 10/22/23  Shoulder flexion 135 135*    Shoulder abduction 110* 110* 110 * 140  Shoulder internal rotation      Shoulder external rotation      Functional IR L2 sacrum    Functional ER T1 T1*    Shoulder extension      Elbow  extension      Elbow flexion       (Blank rows = not tested, N = WNL, * = concordant pain with testing)  UPPER EXTREMITY MMT:  MMT Right (Eval) Left (Eval)  Shoulder flexion 4+ 4+  Shoulder abduction (C5) 4+ 4*  Shoulder ER 5 5  Shoulder IR 5 5  Middle trapezius    Lower trapezius    Shoulder extension    Grip strength    Cervical flexion (C1,C2)    Cervical S/B (C3)    Shoulder shrug (C4)    Elbow flexion (C6)    Elbow ext (C7)    Thumb ext (C8)    Finger abd (T1)    Grossly     (Blank rows = not tested, score listed is out of 5 possible points.  N = WNL, D = diminished, C = clear for gross weakness with myotome testing, * = concordant pain with testing)  UPPER EXTREMITY PROM:  PROM Right (Eval) Left (Eval)  Shoulder flexion  = AROM with pain at end range  Shoulder abduction    Shoulder internal rotation    Shoulder external rotation    Functional IR    Functional ER    Shoulder extension    Elbow extension    Elbow flexion     (Blank rows = not tested, N = WNL, * = concordant pain with testing)  SPECIAL TESTS: R/C test cluster (+)  JOINT MOBILITY TESTING:  Hypomobile bil   PATIENT SURVEYS:  Quick Dash: QuickDASH Score: 36.4 / 100 = 36.4 %    TODAY'S TREATMENT:   [facility] Adult PT Treatment:                                                DATE: 10/22/23 UBE level 3 , 5 minutes total  Pulleys for shoulder ROM , flexion, horiz abdct, scaption  Wall slides scaption, x 10 each  Standing Open books x 8 each  Side lying  shoulder flexion, abduction and horizontal abduction x 10 each bilateral  Side lying left ER 1# x 10 , right 1# x 10  Supine dowel pullover x 15    [facility] Adult PT Treatment  10/09/2023:  Therapeutic Exercise:  UBE 3'/3' - L3 Chest press - 6# ea - 2x15 Supine non-painful arc - 3# x15 S/L shoulder abd - short lever arm - 2x15 Supine SA circles - 3# 1x, 6# 1x - 20 circles CW/CC  Single arm row - 10# - 3x15 Standing shoulder ER RTB Wall  slide - 10x Fwd OH press - 0# Bil ER RTB - 2x10  Neuromuscular re-ed: Rhythmic stabilization at 100 d - 3x1' D2 flexion manual resistance - 3x to fatigue   HOME EXERCISE PROGRAM: Access Code: [email or web address] URL: [email or web address] Date: 10/12/2023 Prepared by: Helene Gasmen  Exercises - Shoulder External Rotation and Scapular Retraction with Resistance  - 1 x daily - 7 x weekly - 3 sets - 10 reps - Seated Overhead Press  - 1 x daily - 7 x weekly - 3 sets - 10 reps 10/09/23 - Sidelying Shoulder Flexion 15 Degrees  - 1 x daily - 4-7 x weekly - 3 sets - 15 reps - Sidelying Shoulder Horizontal Abduction  - 1 x daily - 4-7 x weekly - 3 sets - 15 reps - Sidelying Shoulder External Rotation  - 1 x daily - 4-7 x weekly - 3 sets - 15 reps  Treatment priorities   Eval        Thoracic mobility        Progressive R/C strengthening        Activity modification and avoiding agging HIT workouts                          ASSESSMENT:  CLINICAL IMPRESSION:   10/22/2023:  Tod tolerated session well with no adverse reaction.  He missed last week due to scheduling conflicts.  Reports non-compliance with newest additions to HEP and no change in his pain thus far. Continued  progressing cuff and periscapular strengthening to good effect. Added thoracic rotation stretch with min right shoulder discomfort.  He does have improved left shoulder abduction however notes increased pain with right shoulder abduction today. He reported a good stretch with shoulder pulleys. Continued review of HEP exercises added at last visit. Will be OOT next week on vacation.   EVAL:  Makale is a 58 y.o. male who presents to clinic with signs and sxs consistent with chronic L shoulder pain which has been present at a lesser level for more than a year but has increased in severity over the last 3 months.  Most consistent with SAPS likely exacerbated by HIT workouts involving repetitive heavy OH lifting.   Ari will benefit from skilled PT to address relevant deficits and improve comfort with daily tasks and recreation..    OBJECTIVE IMPAIRMENTS: Pain, shoulder ROM, shoulder strength  ACTIVITY LIMITATIONS: reaching, workouts, driving, lifting  PERSONAL FACTORS: See medical history and pertinent history   REHAB POTENTIAL: Good  CLINICAL DECISION MAKING: Evolving/moderate complexity  EVALUATION COMPLEXITY: Moderate   GOALS:   SHORT TERM GOALS: Target date: 11/01/2023  Shakai will be >75% HEP compliant to improve carryover between sessions and facilitate independent management of condition  Evaluation: ongoing Goal status: INITIAL   LONG TERM GOALS: Target date: 11/29/2023   Marquavious will self report >/= 50% decrease in pain from evaluation to improve function in daily tasks  Evaluation/Baseline: 6/10 max pain Goal status: INITIAL   2.  Gurjot will be able to complete workouts, possibly with some modification, not limited by pain  Evaluation/Baseline: limited Goal status: INITIAL   3.  Nikia will show a >/= 20 pct improvement in his QUICK DASH score (MCID is 10% or ~5 pts) as a proxy for functional improvement   Evaluation/Baseline: QuickDASH Score: 36.4 / 100 = 36.4 % Goal status: INITIAL   4.  Taten will be able to reach behind back for dressing tasks, not limited by pain  Evaluation/Baseline: limited Goal status: INITIAL   5.  Jt will report no instances  on his shoulder locking  Evaluation/Baseline: several instance of shoulder locking up Goal status: INITIAL   PLAN: PT FREQUENCY: 1-2x/week  PT DURATION: 8 weeks  PLANNED INTERVENTIONS:  97164- PT Re-evaluation, 97110-Therapeutic exercises, 97530- Therapeutic activity, [id number]- Neuromuscular re-education, 97535- Self Care, [id number]- Manual therapy, [id number]- Gait training, [id number]- Aquatic Therapy, [phone]- Electrical stimulation (manual), [id number]- Vasopneumatic device, [id number]- Traction (mechanical), [id number]-  Ionotophoresis 4mg /ml Dexamethasone, Taping, Dry Needling, Joint manipulation, and Spinal manipulation.   Harlene Persons, PTA 10/22/23 10:18 AM Phone: [phone] Fax: [phone]

## 2023-10-25 ENCOUNTER — Ambulatory Visit: Admitting: Physical Therapy

## 2023-10-25 ENCOUNTER — Encounter: Payer: Self-pay | Admitting: Physical Therapy

## 2023-10-25 DIAGNOSIS — M25512 Pain in left shoulder: Secondary | ICD-10-CM | POA: Diagnosis not present

## 2023-10-25 DIAGNOSIS — M6281 Muscle weakness (generalized): Secondary | ICD-10-CM

## 2023-10-25 NOTE — Therapy (Signed)
 OUTPATIENT PHYSICAL THERAPY DAILY NOTE   Patient Name: MALEKAI MARKWOOD MRN: [id number] DOB:[birth year], 58 y.o., male Today's Date: 10/25/2023   PT End of Session - 10/25/23 0929     Visit Number 5    Number of Visits --   1-2x/week   Date for PT Re-Evaluation 11/29/23    Authorization Type [employer] - [employer]    PT Start Time 0930    PT Stop Time 1011    PT Time Calculation (min) 41 min           Past Medical History:  Diagnosis Date   Chest pain    Diabetes mellitus without complication (HCC)    GERD (gastroesophageal reflux disease)    Hyperlipidemia    Hypertension    Prediabetes    Sleep apnea 2010   does not use Cpap   Past Surgical History:  Procedure Laterality Date   CARDIAC CATHETERIZATION  2010   Dr Court    CORRECTION HAMMER TOE Left    FOOT SURGERY     bone spur removal   HERNIA REPAIR     LIPOMA EXCISION     x2   SHOULDER ARTHROSCOPY WITH DISTAL CLAVICLE RESECTION Left 09/17/2012   Procedure: SHOULDER ARTHROSCOPY WITH DISTAL CLAVICLE RESECTION;  Surgeon: Maude LELON Right, MD;  Location: [facility] OR;  Service: Orthopedics;  Laterality: Left;  Left shoulder arthroscopy, subacromial decompression, distal clavicle resection.   UMBILICAL HERNIA REPAIR     Patient Active Problem List   Diagnosis Date Noted   High cholesterol 08/28/2022   Hypertension 08/28/2022   Gout 08/28/2022   Sleep apnea 08/28/2022   Osteoarthritis 08/28/2022   Left hip pain 06/19/2016   ELBOW PAIN, LEFT 08/05/2009   SHOULDER IMPINGEMENT SYNDROME 08/05/2009   Lateral epicondylitis 08/05/2009   ANKLE PAIN, LEFT 02/10/2009   OTHER ACQUIRED DEFORMITY OF ANKLE AND FOOT OTHER 02/10/2009   Osteoarthritis of right knee 03/10/2008   KNEE PAIN 03/10/2008   UNEQUAL LEG LENGTH 03/10/2008    PCP: Alvera Reagin, PA  REFERRING PROVIDER: Alvera Reagin, PA  THERAPY DIAG:  Left shoulder pain, unspecified chronicity  Muscle weakness  REFERRING DIAG: Pain in left shoulder  [M25.512]   Rationale for Evaluation and Treatment:  Rehabilitation  SUBJECTIVE:  PERTINENT PAST HISTORY:  HTN, OSA, gout (no episode in years)      PRECAUTIONS: None  WEIGHT BEARING RESTRICTIONS No  FALLS:  Has patient fallen in last 6 months? No, Number of falls: 0  MOI/History of condition:  Onset date: Chronic with inreasing pain over the last several months  SUBJECTIVE STATEMENT  10/25/2023:  Pt reports that he feel his pain level is about the same.  He does feel that the locking is occurring less often.   EVAL: DENMAN PICHARDO is a 58 y.o. male who presents to clinic with chief complaint of L shoulder pain which is chronic for several years but has increased in intensity over the last few months.  He has been completing HIT workouts since 2001 but OH movements are now causing more pain. He also has periods when his shoulder will lock up either with OH or IR movement.  Denies radicular sxs.  Denies significant clicking and popping.   Red flags:  denies   Pain:  Are you having pain? Yes Pain location: L posterior shoulder NPRS scale:  0/10 Aggravating factors: reaching, particularly with abd Relieving factors: rest Pain description: aching Stage: Chronic 24 hour pattern: worse with activity   Occupation: Designer, industrial/product  job - sedentary  Assistive Device: na  Hand Dominance: R  Patient Goals/Specific Activities: pain, dressing, working out   OBJECTIVE:   DIAGNOSTIC FINDINGS:  None in epic - X-ray taken pt reports some type of arthritis   GENERAL OBSERVATION: Fwd head, rounded shoulders     SENSATION: Light touch: Appears intact   PALPATION: TTP muscle belly of infraspinatus  UPPER EXTREMITY AROM:  ROM Right (Eval) Left (Eval) Right 10/22/23 Left 10/22/23  Shoulder flexion 135 135*    Shoulder abduction 110* 110* 110 * 140  Shoulder internal rotation      Shoulder external rotation      Functional IR L2 sacrum    Functional ER T1 T1*    Shoulder  extension      Elbow extension      Elbow flexion       (Blank rows = not tested, N = WNL, * = concordant pain with testing)  UPPER EXTREMITY MMT:  MMT Right (Eval) Left (Eval)  Shoulder flexion 4+ 4+  Shoulder abduction (C5) 4+ 4*  Shoulder ER 5 5  Shoulder IR 5 5  Middle trapezius    Lower trapezius    Shoulder extension    Grip strength    Cervical flexion (C1,C2)    Cervical S/B (C3)    Shoulder shrug (C4)    Elbow flexion (C6)    Elbow ext (C7)    Thumb ext (C8)    Finger abd (T1)    Grossly     (Blank rows = not tested, score listed is out of 5 possible points.  N = WNL, D = diminished, C = clear for gross weakness with myotome testing, * = concordant pain with testing)  UPPER EXTREMITY PROM:  PROM Right (Eval) Left (Eval)  Shoulder flexion  = AROM with pain at end range  Shoulder abduction    Shoulder internal rotation    Shoulder external rotation    Functional IR    Functional ER    Shoulder extension    Elbow extension    Elbow flexion     (Blank rows = not tested, N = WNL, * = concordant pain with testing)  SPECIAL TESTS: R/C test cluster (+)  JOINT MOBILITY TESTING:  Hypomobile bil   PATIENT SURVEYS:  Quick Dash: QuickDASH Score: 36.4 / 100 = 36.4 %    TODAY'S TREATMENT:   [facility] Adult PT Treatment:                                                DATE: 10/25/23 UBE level 3 , 5 minutes total  Pulleys for shoulder ROM , flexion, horiz abdct, scaption  Prone on ball W and T - 2x7 Prone row - 6# - 2x15 Side lying left ER 2# - 2x to fatigue Foam roller routine for thoracic mobility - including protraction/retraction (unilateral and bilateral), cc/cw circles, horizontal abduction, shoulder flexion/ext alternating, shoulder abduction Ball circles on wall 20x ea red and then yellow  [facility] Adult PT Treatment  10/09/2023:  Therapeutic Exercise:  UBE 3'/3' - L3 Chest press - 6# ea - 2x15 Supine non-painful arc - 3# x15 S/L shoulder abd - short  lever arm - 2x15 Supine SA circles - 3# 1x, 6# 1x - 20 circles CW/CC  Single arm row - 10# - 3x15 Standing shoulder ER RTB Wall slide -  10x Fwd OH press - 0# Bil ER RTB - 2x10  Neuromuscular re-ed: Rhythmic stabilization at 100 d - 3x1' D2 flexion manual resistance - 3x to fatigue   HOME EXERCISE PROGRAM: Access Code: [email or web address] URL: [email or web address] Date: 10/12/2023 Prepared by: Helene Gasmen  Exercises - Shoulder External Rotation and Scapular Retraction with Resistance  - 1 x daily - 7 x weekly - 3 sets - 10 reps - Seated Overhead Press  - 1 x daily - 7 x weekly - 3 sets - 10 reps 10/09/23 - Sidelying Shoulder Flexion 15 Degrees  - 1 x daily - 4-7 x weekly - 3 sets - 15 reps - Sidelying Shoulder Horizontal Abduction  - 1 x daily - 4-7 x weekly - 3 sets - 15 reps - Sidelying Shoulder External Rotation  - 1 x daily - 4-7 x weekly - 3 sets - 15 reps  Treatment priorities   Eval        Thoracic mobility        Progressive R/C strengthening        Activity modification and avoiding agging HIT workouts                          ASSESSMENT:  CLINICAL IMPRESSION:   10/25/2023:  Tod tolerated session well with no adverse reaction.  Overall beginning to see positive progress with reduced instances of locking and improved strength with ER activities.  Kept HEP the same for now with instructions to add weight has tolerated with pain levels <= 4/10.  Complaints of R shoulder pain today.    EVAL:  Sly is a 58 y.o. male who presents to clinic with signs and sxs consistent with chronic L shoulder pain which has been present at a lesser level for more than a year but has increased in severity over the last 3 months.  Most consistent with SAPS likely exacerbated by HIT workouts involving repetitive heavy OH lifting.  Malic will benefit from skilled PT to address relevant deficits and improve comfort with daily tasks and recreation..    OBJECTIVE IMPAIRMENTS:  Pain, shoulder ROM, shoulder strength  ACTIVITY LIMITATIONS: reaching, workouts, driving, lifting  PERSONAL FACTORS: See medical history and pertinent history   REHAB POTENTIAL: Good  CLINICAL DECISION MAKING: Evolving/moderate complexity  EVALUATION COMPLEXITY: Moderate   GOALS:   SHORT TERM GOALS: Target date: 11/01/2023  Hayk will be >75% HEP compliant to improve carryover between sessions and facilitate independent management of condition  Evaluation: ongoing Goal status: INITIAL   LONG TERM GOALS: Target date: 11/29/2023   Aj will self report >/= 50% decrease in pain from evaluation to improve function in daily tasks  Evaluation/Baseline: 6/10 max pain Goal status: INITIAL   2.  Marquest will be able to complete workouts, possibly with some modification, not limited by pain  Evaluation/Baseline: limited Goal status: INITIAL   3.  Benjaman will show a >/= 20 pct improvement in his QUICK DASH score (MCID is 10% or ~5 pts) as a proxy for functional improvement   Evaluation/Baseline: QuickDASH Score: 36.4 / 100 = 36.4 % Goal status: INITIAL   4.  Branton will be able to reach behind back for dressing tasks, not limited by pain  Evaluation/Baseline: limited Goal status: INITIAL   5.  Okie will report no instances on his shoulder locking  Evaluation/Baseline: several instance of shoulder locking up Goal status: INITIAL   PLAN: PT FREQUENCY: 1-2x/week  PT DURATION: 8 weeks  PLANNED  INTERVENTIONS:  97164- PT Re-evaluation, 97110-Therapeutic exercises, 97530- Therapeutic activity, [id number]- Neuromuscular re-education, 97535- Self Care, 02859- Manual therapy, [id number]- Gait training, [phone]- Aquatic Therapy, [phone]- Electrical stimulation (manual), [id number]- Vasopneumatic device, [id number]- Traction (mechanical), [id number]- Ionotophoresis 4mg /ml Dexamethasone, Taping, Dry Needling, Joint manipulation, and Spinal manipulation.   Helene BRAVO Nelton Amsden PT 10/25/23 10:12  AM Phone: [phone] Fax: [phone]

## 2023-10-29 ENCOUNTER — Ambulatory Visit: Admitting: Physical Therapy

## 2023-11-01 ENCOUNTER — Encounter: Admitting: Physical Therapy

## 2023-11-05 ENCOUNTER — Other Ambulatory Visit (HOSPITAL_COMMUNITY): Payer: Self-pay

## 2023-11-05 DIAGNOSIS — M109 Gout, unspecified: Secondary | ICD-10-CM | POA: Diagnosis not present

## 2023-11-05 DIAGNOSIS — E7800 Pure hypercholesterolemia, unspecified: Secondary | ICD-10-CM | POA: Diagnosis not present

## 2023-11-05 DIAGNOSIS — E1169 Type 2 diabetes mellitus with other specified complication: Secondary | ICD-10-CM | POA: Diagnosis not present

## 2023-11-05 DIAGNOSIS — E1122 Type 2 diabetes mellitus with diabetic chronic kidney disease: Secondary | ICD-10-CM | POA: Diagnosis not present

## 2023-11-05 DIAGNOSIS — G4733 Obstructive sleep apnea (adult) (pediatric): Secondary | ICD-10-CM | POA: Diagnosis not present

## 2023-11-05 DIAGNOSIS — I10 Essential (primary) hypertension: Secondary | ICD-10-CM | POA: Diagnosis not present

## 2023-11-05 DIAGNOSIS — K219 Gastro-esophageal reflux disease without esophagitis: Secondary | ICD-10-CM | POA: Diagnosis not present

## 2023-11-05 MED ORDER — GABAPENTIN 300 MG PO CAPS
300.0000 mg | ORAL_CAPSULE | Freq: Two times a day (BID) | ORAL | 0 refills | Status: AC
  Filled 2023-11-05 – 2023-11-07 (×2): qty 180, 90d supply, fill #0

## 2023-11-06 ENCOUNTER — Encounter: Payer: Self-pay | Admitting: Physical Therapy

## 2023-11-06 ENCOUNTER — Ambulatory Visit: Attending: Sports Medicine | Admitting: Physical Therapy

## 2023-11-06 ENCOUNTER — Other Ambulatory Visit (HOSPITAL_COMMUNITY): Payer: Self-pay

## 2023-11-06 DIAGNOSIS — M25512 Pain in left shoulder: Secondary | ICD-10-CM | POA: Diagnosis not present

## 2023-11-06 DIAGNOSIS — M6281 Muscle weakness (generalized): Secondary | ICD-10-CM | POA: Insufficient documentation

## 2023-11-06 NOTE — Therapy (Signed)
 OUTPATIENT PHYSICAL THERAPY DAILY NOTE   Patient Name: Kerry Nelson MRN: [id number] DOB:11-24-1965, 58 y.o., male Today's Date: 11/06/2023   PT End of Session - 11/06/23 1537     Visit Number 6    Number of Visits --   1-2x/week   Date for PT Re-Evaluation 11/29/23    Authorization Type [employer] Employee - Junie [employer]    PT Start Time 1540    PT Stop Time 1620    PT Time Calculation (min) 40 min           Past Medical History:  Diagnosis Date   Chest pain    Diabetes mellitus without complication (HCC)    GERD (gastroesophageal reflux disease)    Hyperlipidemia    Hypertension    Prediabetes    Sleep apnea 2010   does not use Cpap   Past Surgical History:  Procedure Laterality Date   CARDIAC CATHETERIZATION  2010   Dr Court    CORRECTION HAMMER TOE Left    FOOT SURGERY     bone spur removal   HERNIA REPAIR     LIPOMA EXCISION     x2   SHOULDER ARTHROSCOPY WITH DISTAL CLAVICLE RESECTION Left 09/17/2012   Procedure: SHOULDER ARTHROSCOPY WITH DISTAL CLAVICLE RESECTION;  Surgeon: Maude LELON Right, MD;  Location: [facility] OR;  Service: Orthopedics;  Laterality: Left;  Left shoulder arthroscopy, subacromial decompression, distal clavicle resection.   UMBILICAL HERNIA REPAIR     Patient Active Problem List   Diagnosis Date Noted   High cholesterol 08/28/2022   Hypertension 08/28/2022   Gout 08/28/2022   Sleep apnea 08/28/2022   Osteoarthritis 08/28/2022   Left hip pain 06/19/2016   ELBOW PAIN, LEFT 08/05/2009   SHOULDER IMPINGEMENT SYNDROME 08/05/2009   Lateral epicondylitis 08/05/2009   ANKLE PAIN, LEFT 02/10/2009   OTHER ACQUIRED DEFORMITY OF ANKLE AND FOOT OTHER 02/10/2009   Osteoarthritis of right knee 03/10/2008   KNEE PAIN 03/10/2008   UNEQUAL LEG LENGTH 03/10/2008    PCP: Alvera Reagin, PA  REFERRING PROVIDER: Dasie Fitch, MD  THERAPY DIAG:  Left shoulder pain, unspecified chronicity  Muscle weakness  REFERRING DIAG: Pain in left shoulder  [M25.512]   Rationale for Evaluation and Treatment:  Rehabilitation  SUBJECTIVE:  PERTINENT PAST HISTORY:  HTN, OSA, gout (no episode in years)      PRECAUTIONS: None  WEIGHT BEARING RESTRICTIONS No  FALLS:  Has patient fallen in last 6 months? No, Number of falls: 0  MOI/History of condition:  Onset date: Chronic with inreasing pain over the last several months  SUBJECTIVE STATEMENT  11/06/2023:  Pt reports that his shoulder is getting stronger overall but continues to have instances of cramping or locking.  EVAL: Kerry Nelson is a 58 y.o. male who presents to clinic with chief complaint of L shoulder pain which is chronic for several years but has increased in intensity over the last few months.  He has been completing HIT workouts since 2001 but OH movements are now causing more pain. He also has periods when his shoulder will lock up either with OH or IR movement.  Denies radicular sxs.  Denies significant clicking and popping.   Red flags:  denies   Pain:  Are you having pain? Yes Pain location: L posterior shoulder NPRS scale:  0/10 Aggravating factors: reaching, particularly with abd Relieving factors: rest Pain description: aching Stage: Chronic 24 hour pattern: worse with activity   Occupation: Designer, industrial/product job - sedentary  Assistive Device:  na  Hand Dominance: R  Patient Goals/Specific Activities: pain, dressing, working out   OBJECTIVE:   DIAGNOSTIC FINDINGS:  None in epic - X-ray taken pt reports some type of arthritis   GENERAL OBSERVATION: Fwd head, rounded shoulders     SENSATION: Light touch: Appears intact   PALPATION: TTP muscle belly of infraspinatus  UPPER EXTREMITY AROM:  ROM Right (Eval) Left (Eval) Right 10/22/23 Left 10/22/23  Shoulder flexion 135 135*    Shoulder abduction 110* 110* 110 * 140  Shoulder internal rotation      Shoulder external rotation      Functional IR L2 sacrum    Functional ER T1 T1*    Shoulder  extension      Elbow extension      Elbow flexion       (Blank rows = not tested, N = WNL, * = concordant pain with testing)  UPPER EXTREMITY MMT:  MMT Right (Eval) Left (Eval)  Shoulder flexion 4+ 4+  Shoulder abduction (C5) 4+ 4*  Shoulder ER 5 5  Shoulder IR 5 5  Middle trapezius    Lower trapezius    Shoulder extension    Grip strength    Cervical flexion (C1,C2)    Cervical S/B (C3)    Shoulder shrug (C4)    Elbow flexion (C6)    Elbow ext (C7)    Thumb ext (C8)    Finger abd (T1)    Grossly     (Blank rows = not tested, score listed is out of 5 possible points.  N = WNL, D = diminished, C = clear for gross weakness with myotome testing, * = concordant pain with testing)  UPPER EXTREMITY PROM:  PROM Right (Eval) Left (Eval)  Shoulder flexion  = AROM with pain at end range  Shoulder abduction    Shoulder internal rotation    Shoulder external rotation    Functional IR    Functional ER    Shoulder extension    Elbow extension    Elbow flexion     (Blank rows = not tested, N = WNL, * = concordant pain with testing)  SPECIAL TESTS: R/C test cluster (+)  JOINT MOBILITY TESTING:  Hypomobile bil   PATIENT SURVEYS:  Quick Dash: QuickDASH Score: 36.4 / 100 = 36.4 %    TODAY'S TREATMENT:   [facility] Adult PT Treatment:                                                DATE: 11/06/23  Therex:  Pulleys for shoulder ROM , flexion, horiz abdct, scaption  Prone on ball W, T, Y - 3x8 Bil ER - GTB - 3x15 Bus driver - RTB - [id number] Standing land mine press - 3x15 Seated row - 20# - low and high - 2x10 ea UE ranger - 34'' - flexion  [facility] Adult PT Treatment  10/09/2023:  Therapeutic Exercise:  UBE 3'/3' - L3 Chest press - 6# ea - 2x15 Supine non-painful arc - 3# x15 S/L shoulder abd - short lever arm - 2x15 Supine SA circles - 3# 1x, 6# 1x - 20 circles CW/CC  Single arm row - 10# - 3x15 Standing shoulder ER RTB Wall slide - 10x Fwd OH press - 0# Bil ER RTB -  2x10  Neuromuscular re-ed: Rhythmic stabilization at 100 d - 3x1' D2  flexion manual resistance - 3x to fatigue   HOME EXERCISE PROGRAM: Access Code: [email or web address] URL: [email or web address] Date: 11/06/2023 Prepared by: Helene Gasmen  Exercises - Shoulder External Rotation and Scapular Retraction with Resistance  - 1 x daily - 7 x weekly - 3 sets - 10 reps - Seated Overhead Press  - 1 x daily - 7 x weekly - 3 sets - 10 reps - Sidelying Shoulder Flexion 15 Degrees  - 1 x daily - 4-7 x weekly - 3 sets - 15 reps - Sidelying Shoulder Horizontal Abduction  - 1 x daily - 4-7 x weekly - 3 sets - 15 reps - Sidelying Shoulder External Rotation  - 1 x daily - 4-7 x weekly - 3 sets - 15 reps - Standing Single Arm Elbow Flexion with Resistance  - 1 x daily - 4-7 x weekly - 3 sets - 10 reps - Standing Elbow Extension with Self-Anchored Resistance  - 1 x daily - 4-7 x weekly - 3 sets - 10 reps - Prone Middle Trapezius Strengthening on Swiss Ball  - 1 x daily - 7 x weekly - 2-3 sets - 10 reps - Prone Shoulder W on [employer]  - 1 x daily - 7 x weekly - 2-3 sets - 10 reps - Prone Lower Trapezius Strengthening on Swiss Ball  - 1 x daily - 7 x weekly - 2-3 sets - 10 reps  Treatment priorities   Eval        Thoracic mobility        Progressive R/C strengthening        Activity modification and avoiding agging HIT workouts                          ASSESSMENT:  CLINICAL IMPRESSION:   11/06/2023:  Tod tolerated session well with no adverse reaction.  Clear strength gains with ER in neutral positions.  Began working on resisted OH movements with landmine press today to neutral which was tolerated well.  Pt able to move OH through full flexion range following therapy.  Plan on D/C next visit with HEP.  EVAL:  Kerry Nelson is a 58 y.o. male who presents to clinic with signs and sxs consistent with chronic L shoulder pain which has been present at a lesser level for more than a year but has  increased in severity over the last 3 months.  Most consistent with SAPS likely exacerbated by HIT workouts involving repetitive heavy OH lifting.  Kerry Nelson will benefit from skilled PT to address relevant deficits and improve comfort with daily tasks and recreation..    OBJECTIVE IMPAIRMENTS: Pain, shoulder ROM, shoulder strength  ACTIVITY LIMITATIONS: reaching, workouts, driving, lifting  PERSONAL FACTORS: See medical history and pertinent history   REHAB POTENTIAL: Good  CLINICAL DECISION MAKING: Evolving/moderate complexity  EVALUATION COMPLEXITY: Moderate   GOALS:   SHORT TERM GOALS: Target date: 11/01/2023  Tres will be >75% HEP compliant to improve carryover between sessions and facilitate independent management of condition  Evaluation: ongoing Goal status: INITIAL   LONG TERM GOALS: Target date: 11/29/2023   Kerry Nelson will self report >/= 50% decrease in pain from evaluation to improve function in daily tasks  Evaluation/Baseline: 6/10 max pain Goal status: INITIAL   2.  Kerry Nelson will be able to complete workouts, possibly with some modification, not limited by pain  Evaluation/Baseline: limited Goal status: INITIAL   3.  Kerry Nelson will show a >/= 20 pct improvement in his QUICK  DASH score (MCID is 10% or ~5 pts) as a proxy for functional improvement   Evaluation/Baseline: QuickDASH Score: 36.4 / 100 = 36.4 % Goal status: INITIAL   4.  Kerry Nelson will be able to reach behind back for dressing tasks, not limited by pain  Evaluation/Baseline: limited Goal status: INITIAL   5.  Kerry Nelson will report no instances on his shoulder locking  Evaluation/Baseline: several instance of shoulder locking up Goal status: INITIAL   PLAN: PT FREQUENCY: 1-2x/week  PT DURATION: 8 weeks  PLANNED INTERVENTIONS:  97164- PT Re-evaluation, 97110-Therapeutic exercises, 97530- Therapeutic activity, [id number]- Neuromuscular re-education, 97535- Self Care, 02859- Manual therapy, [id number]-  Gait training, [id number]- Aquatic Therapy, [phone]- Electrical stimulation (manual), [id number]- Vasopneumatic device, [id number]- Traction (mechanical), [id number]- Ionotophoresis 4mg /ml Dexamethasone, Taping, Dry Needling, Joint manipulation, and Spinal manipulation.   Helene BRAVO Annalei Friesz PT 11/06/23 4:29 PM Phone: [phone] Fax: [phone]

## 2023-11-07 ENCOUNTER — Other Ambulatory Visit (HOSPITAL_COMMUNITY): Payer: Self-pay

## 2023-11-07 MED ORDER — DEXCOM G6 SENSOR MISC
2 refills | Status: DC
  Filled 2023-11-07: qty 3, 30d supply, fill #0
  Filled 2023-11-29 – 2023-12-05 (×2): qty 3, 30d supply, fill #1
  Filled 2023-12-22 – 2024-01-01 (×2): qty 3, 30d supply, fill #2

## 2023-11-09 ENCOUNTER — Encounter: Payer: Self-pay | Admitting: Physical Therapy

## 2023-11-09 ENCOUNTER — Ambulatory Visit: Admitting: Physical Therapy

## 2023-11-09 DIAGNOSIS — M6281 Muscle weakness (generalized): Secondary | ICD-10-CM | POA: Diagnosis not present

## 2023-11-09 DIAGNOSIS — M25512 Pain in left shoulder: Secondary | ICD-10-CM | POA: Diagnosis not present

## 2023-11-09 NOTE — Therapy (Unsigned)
 PHYSICAL THERAPY DISCHARGE SUMMARY  Visits from Start of Care: 7  Current functional level related to goals / functional outcomes: See assessment/goals   Remaining deficits: See assessment/goals   Education / Equipment: HEP and D/C plans  Patient agrees to discharge. Patient goals were met or partially met. Patient is being discharged due to the patient's request.   Patient Name: Kerry Nelson MRN: [id number] DOB:[birth year], 58 y.o., male Today's Date: 11/10/2023   PT End of Session - 11/09/23 1313     Visit Number 7    Number of Visits --   1-2x/week   Date for PT Re-Evaluation 11/29/23    Authorization Type  Employee - Junie Palin    PT Start Time 1315    PT Stop Time 1356    PT Time Calculation (min) 41 min           Past Medical History:  Diagnosis Date   Chest pain    Diabetes mellitus without complication (HCC)    GERD (gastroesophageal reflux disease)    Hyperlipidemia    Hypertension    Prediabetes    Sleep apnea 2010   does not use Cpap   Past Surgical History:  Procedure Laterality Date   CARDIAC CATHETERIZATION  2010   Dr Court    CORRECTION HAMMER TOE Left    FOOT SURGERY     bone spur removal   HERNIA REPAIR     LIPOMA EXCISION     x2   SHOULDER ARTHROSCOPY WITH DISTAL CLAVICLE RESECTION Left 09/17/2012   Procedure: SHOULDER ARTHROSCOPY WITH DISTAL CLAVICLE RESECTION;  Surgeon: Maude LELON Right, MD;  Location: [facility] OR;  Service: Orthopedics;  Laterality: Left;  Left shoulder arthroscopy, subacromial decompression, distal clavicle resection.   UMBILICAL HERNIA REPAIR     Patient Active Problem List   Diagnosis Date Noted   High cholesterol 08/28/2022   Hypertension 08/28/2022   Gout 08/28/2022   Sleep apnea 08/28/2022   Osteoarthritis 08/28/2022   Left hip pain 06/19/2016   ELBOW PAIN, LEFT 08/05/2009   SHOULDER IMPINGEMENT SYNDROME 08/05/2009   Lateral epicondylitis 08/05/2009   ANKLE PAIN, LEFT 02/10/2009   OTHER ACQUIRED  DEFORMITY OF ANKLE AND FOOT OTHER 02/10/2009   Osteoarthritis of right knee 03/10/2008   KNEE PAIN 03/10/2008   UNEQUAL LEG LENGTH 03/10/2008    PCP: Alvera Reagin, PA  REFERRING PROVIDER: Alvera Reagin, PA  THERAPY DIAG:  Left shoulder pain, unspecified chronicity  Muscle weakness  REFERRING DIAG: Pain in left shoulder [M25.512]   Rationale for Evaluation and Treatment:  Rehabilitation  SUBJECTIVE:  PERTINENT PAST HISTORY:  HTN, OSA, gout (no episode in years)      PRECAUTIONS: None  WEIGHT BEARING RESTRICTIONS No  FALLS:  Has patient fallen in last 6 months? No, Number of falls: 0  MOI/History of condition:  Onset date: Chronic with inreasing pain over the last several months  SUBJECTIVE STATEMENT  11/10/2023:  Pt reports that he continues to have some residual soreness and occasional locking but overall has seen about 50% improvement since starting therapy.   EVAL: Kerry Nelson is a 58 y.o. male who presents to clinic with chief complaint of L shoulder pain which is chronic for several years but has increased in intensity over the last few months.  He has been completing HIT workouts since 2001 but OH movements are now causing more pain. He also has periods when his shoulder will lock up either with OH or IR movement.  Denies radicular sxs.  Denies significant clicking and popping.   Red flags:  denies   Pain:  Are you having pain? Yes Pain location: L posterior shoulder NPRS scale:  0/10 Aggravating factors: reaching, particularly with abd Relieving factors: rest Pain description: aching Stage: Chronic 24 hour pattern: worse with activity   Occupation: Designer, industrial/product job - sedentary  Assistive Device: na  Hand Dominance: R  Patient Goals/Specific Activities: pain, dressing, working out   OBJECTIVE:   DIAGNOSTIC FINDINGS:  None in epic - X-ray taken pt reports some type of arthritis   GENERAL OBSERVATION: Fwd head, rounded  shoulders     SENSATION: Light touch: Appears intact   PALPATION: TTP muscle belly of infraspinatus  UPPER EXTREMITY AROM:  ROM Right (Eval) Left (Eval) Right 10/22/23 Left 10/22/23  Shoulder flexion 135 135*    Shoulder abduction 110* 110* 110 * 140  Shoulder internal rotation      Shoulder external rotation      Functional IR L2 sacrum    Functional ER T1 T1*    Shoulder extension      Elbow extension      Elbow flexion       (Blank rows = not tested, N = WNL, * = concordant pain with testing)  UPPER EXTREMITY MMT:  MMT Right (Eval) Left (Eval)  Shoulder flexion 4+ 4+  Shoulder abduction (C5) 4+ 4*  Shoulder ER 5 5  Shoulder IR 5 5  Middle trapezius    Lower trapezius    Shoulder extension    Grip strength    Cervical flexion (C1,C2)    Cervical S/B (C3)    Shoulder shrug (C4)    Elbow flexion (C6)    Elbow ext (C7)    Thumb ext (C8)    Finger abd (T1)    Grossly     (Blank rows = not tested, score listed is out of 5 possible points.  N = WNL, D = diminished, C = clear for gross weakness with myotome testing, * = concordant pain with testing)  UPPER EXTREMITY PROM:  PROM Right (Eval) Left (Eval)  Shoulder flexion  = AROM with pain at end range  Shoulder abduction    Shoulder internal rotation    Shoulder external rotation    Functional IR    Functional ER    Shoulder extension    Elbow extension    Elbow flexion     (Blank rows = not tested, N = WNL, * = concordant pain with testing)  SPECIAL TESTS: R/C test cluster (+)  JOINT MOBILITY TESTING:  Hypomobile bil   PATIENT SURVEYS:  Quick Dash: QuickDASH Score: 36.4 / 100 = 36.4 %    TODAY'S TREATMENT:   [facility] Adult PT Treatment:                                                DATE: 11/09/23  Therex:  Pulleys for shoulder ROM , flexion, horiz abdct, scaption  Prone on ball W, T, Y - 3x8 Supported ER - 2# - @45  degrees Seated row - 25# - low and high - 2x10 ea UE ranger - 34'' -  flexion Updating and reviewing HEP  Therapeutic Activity  collecting information for goals, checking progress, and reviewing with patient    HOME EXERCISE PROGRAM: Access Code: [email or web address] URL: [email or web address] Date: 11/06/2023 Prepared by: Helene Gasmen  Exercises -  Shoulder External Rotation and Scapular Retraction with Resistance  - 1 x daily - 7 x weekly - 3 sets - 10 reps - Seated Overhead Press  - 1 x daily - 7 x weekly - 3 sets - 10 reps - Sidelying Shoulder Flexion 15 Degrees  - 1 x daily - 4-7 x weekly - 3 sets - 15 reps - Sidelying Shoulder Horizontal Abduction  - 1 x daily - 4-7 x weekly - 3 sets - 15 reps - Sidelying Shoulder External Rotation  - 1 x daily - 4-7 x weekly - 3 sets - 15 reps - Standing Single Arm Elbow Flexion with Resistance  - 1 x daily - 4-7 x weekly - 3 sets - 10 reps - Standing Elbow Extension with Self-Anchored Resistance  - 1 x daily - 4-7 x weekly - 3 sets - 10 reps - Prone Middle Trapezius Strengthening on Swiss Ball  - 1 x daily - 7 x weekly - 2-3 sets - 10 reps - Prone Shoulder W on [employer]  - 1 x daily - 7 x weekly - 2-3 sets - 10 reps - Prone Lower Trapezius Strengthening on Swiss Ball  - 1 x daily - 7 x weekly - 2-3 sets - 10 reps  Treatment priorities   Eval        Thoracic mobility        Progressive R/C strengthening        Activity modification and avoiding agging HIT workouts                          ASSESSMENT:  CLINICAL IMPRESSION:   11/10/2023:  Tod tolerated session well with no adverse reaction.  He has made good progress toward goals since starting therapy.  He has had improvements in max pain, instances of locking, and ADLs such as dressing.  He reports overall roughly 50% improvement in sxs since starting therapy.  Unfortunately, he continues to have pain with OH movements and has had a few instances of locking as well over the last week.  He would like to continue at home with HEP and D/C.  We  discussed continuing exercise for another 6-8 weeks to see full benefits of conservative management.  He expresses confidence with this.  EVAL:  Chaos is a 58 y.o. male who presents to clinic with signs and sxs consistent with chronic L shoulder pain which has been present at a lesser level for more than a year but has increased in severity over the last 3 months.  Most consistent with SAPS likely exacerbated by HIT workouts involving repetitive heavy OH lifting.  Sevon will benefit from skilled PT to address relevant deficits and improve comfort with daily tasks and recreation..    OBJECTIVE IMPAIRMENTS: Pain, shoulder ROM, shoulder strength  ACTIVITY LIMITATIONS: reaching, workouts, driving, lifting  PERSONAL FACTORS: See medical history and pertinent history   REHAB POTENTIAL: Good  CLINICAL DECISION MAKING: Evolving/moderate complexity  EVALUATION COMPLEXITY: Moderate   GOALS:   SHORT TERM GOALS: Target date: 11/01/2023  Christophr will be >75% HEP compliant to improve carryover between sessions and facilitate independent management of condition  Evaluation: ongoing Goal status: MET   LONG TERM GOALS: Target date: 11/29/2023   Ondra will self report >/= 50% decrease in pain from evaluation to improve function in daily tasks  Evaluation/Baseline: 6/10 max pain 7/11: 3/10 Goal status: MET   2.  Baley will be able to complete workouts, possibly with  some modification, not limited by pain  Evaluation/Baseline: limited 7/11:  feels confident he could participate  Goal status: MET   3.  Larsen will show a >/= 20 pct improvement in his QUICK DASH score (MCID is 10% or ~5 pts) as a proxy for functional improvement   Evaluation/Baseline: QuickDASH Score: 36.4 / 100 = 36.4 % 7/11: QuickDASH Score: 9.1 / 100 = 9.1 % Goal status: MET   4.  Jamarrius will be able to reach behind back for dressing tasks, not limited by pain  Evaluation/Baseline: limited Goal status:  MET   5.  Braydan will report no instances on his shoulder locking  Evaluation/Baseline: several instance of shoulder locking up 7/11: reduced significantly but still occurs occasionally  Goal status: Partially MET   PLAN: PT FREQUENCY: 1-2x/week  PT DURATION: 8 weeks  PLANNED INTERVENTIONS:  97164- PT Re-evaluation, 97110-Therapeutic exercises, 97530- Therapeutic activity, 97112- Neuromuscular re-education, 97535- Self Care, 02859- Manual therapy, [id number]- Gait training, [id number]- Aquatic Therapy, [phone]- Electrical stimulation (manual), [id number]- Vasopneumatic device, [id number]- Traction (mechanical), [id number]- Ionotophoresis 4mg /ml Dexamethasone, Taping, Dry Needling, Joint manipulation, and Spinal manipulation.   Helene BRAVO Cynde Menard PT 11/10/23 8:12 AM Phone: [phone] Fax: [phone]

## 2023-11-10 ENCOUNTER — Encounter: Payer: Self-pay | Admitting: Physical Therapy

## 2023-11-29 ENCOUNTER — Other Ambulatory Visit (HOSPITAL_COMMUNITY): Payer: Self-pay

## 2023-11-29 ENCOUNTER — Other Ambulatory Visit: Payer: Self-pay

## 2023-11-29 MED ORDER — KERENDIA 10 MG PO TABS
10.0000 mg | ORAL_TABLET | Freq: Every day | ORAL | 2 refills | Status: AC
  Filled 2023-11-29 – 2023-11-30 (×2): qty 90, 90d supply, fill #0
  Filled 2024-04-08: qty 90, 90d supply, fill #1

## 2023-11-30 ENCOUNTER — Other Ambulatory Visit (HOSPITAL_COMMUNITY): Payer: Self-pay

## 2023-12-04 ENCOUNTER — Other Ambulatory Visit (HOSPITAL_COMMUNITY): Payer: Self-pay

## 2023-12-04 MED ORDER — ROSUVASTATIN CALCIUM 40 MG PO TABS
40.0000 mg | ORAL_TABLET | Freq: Every day | ORAL | 2 refills | Status: AC
  Filled 2023-12-04: qty 90, 90d supply, fill #0

## 2023-12-05 ENCOUNTER — Other Ambulatory Visit (HOSPITAL_COMMUNITY): Payer: Self-pay

## 2023-12-06 ENCOUNTER — Other Ambulatory Visit (HOSPITAL_COMMUNITY): Payer: Self-pay

## 2023-12-22 ENCOUNTER — Other Ambulatory Visit (HOSPITAL_COMMUNITY): Payer: Self-pay

## 2023-12-23 ENCOUNTER — Other Ambulatory Visit (HOSPITAL_COMMUNITY): Payer: Self-pay

## 2023-12-24 ENCOUNTER — Other Ambulatory Visit (HOSPITAL_COMMUNITY): Payer: Self-pay

## 2023-12-24 ENCOUNTER — Other Ambulatory Visit: Payer: Self-pay

## 2023-12-24 MED ORDER — ESOMEPRAZOLE MAGNESIUM 40 MG PO CPDR
40.0000 mg | DELAYED_RELEASE_CAPSULE | Freq: Every day | ORAL | 1 refills | Status: AC
  Filled 2023-12-24: qty 90, 90d supply, fill #0
  Filled 2024-04-30 – 2024-05-14 (×2): qty 90, 90d supply, fill #1

## 2023-12-24 MED ORDER — EZETIMIBE 10 MG PO TABS
10.0000 mg | ORAL_TABLET | Freq: Every day | ORAL | 1 refills | Status: DC
  Filled 2023-12-24: qty 90, 90d supply, fill #0
  Filled 2024-04-08: qty 90, 90d supply, fill #1

## 2023-12-24 MED ORDER — JARDIANCE 25 MG PO TABS
25.0000 mg | ORAL_TABLET | Freq: Every day | ORAL | 1 refills | Status: AC
  Filled 2023-12-24: qty 90, 90d supply, fill #0

## 2024-01-01 ENCOUNTER — Other Ambulatory Visit (HOSPITAL_COMMUNITY): Payer: Self-pay

## 2024-01-22 ENCOUNTER — Other Ambulatory Visit (HOSPITAL_COMMUNITY): Payer: Self-pay

## 2024-01-22 ENCOUNTER — Other Ambulatory Visit: Payer: Self-pay

## 2024-01-22 MED ORDER — LOSARTAN POTASSIUM-HCTZ 100-25 MG PO TABS
1.0000 | ORAL_TABLET | Freq: Every day | ORAL | 1 refills | Status: AC
  Filled 2024-01-22: qty 90, 90d supply, fill #0
  Filled 2024-06-03: qty 90, 90d supply, fill #1

## 2024-02-01 ENCOUNTER — Other Ambulatory Visit: Payer: Self-pay

## 2024-02-01 ENCOUNTER — Other Ambulatory Visit (HOSPITAL_COMMUNITY): Payer: Self-pay

## 2024-02-01 MED ORDER — DEXCOM G6 SENSOR MISC
1.0000 | 2 refills | Status: DC
  Filled 2024-02-01: qty 3, 30d supply, fill #0
  Filled 2024-02-28: qty 3, 30d supply, fill #1
  Filled 2024-04-08: qty 3, 30d supply, fill #2

## 2024-02-28 ENCOUNTER — Other Ambulatory Visit (HOSPITAL_COMMUNITY): Payer: Self-pay

## 2024-04-08 ENCOUNTER — Other Ambulatory Visit (HOSPITAL_COMMUNITY): Payer: Self-pay

## 2024-04-09 ENCOUNTER — Other Ambulatory Visit (HOSPITAL_COMMUNITY): Payer: Self-pay

## 2024-04-09 ENCOUNTER — Other Ambulatory Visit: Payer: Self-pay

## 2024-04-09 MED ORDER — GABAPENTIN 300 MG PO CAPS
300.0000 mg | ORAL_CAPSULE | Freq: Two times a day (BID) | ORAL | 0 refills | Status: AC
  Filled 2024-04-09: qty 180, 90d supply, fill #0

## 2024-04-09 MED ORDER — ROSUVASTATIN CALCIUM 40 MG PO TABS
40.0000 mg | ORAL_TABLET | Freq: Every day | ORAL | 0 refills | Status: AC
  Filled 2024-04-09: qty 90, 90d supply, fill #0

## 2024-04-09 MED ORDER — METFORMIN HCL 1000 MG PO TABS
1000.0000 mg | ORAL_TABLET | Freq: Two times a day (BID) | ORAL | 0 refills | Status: AC
  Filled 2024-04-09: qty 180, 90d supply, fill #0

## 2024-04-30 ENCOUNTER — Other Ambulatory Visit (HOSPITAL_COMMUNITY): Payer: Self-pay

## 2024-04-30 ENCOUNTER — Other Ambulatory Visit: Payer: Self-pay

## 2024-04-30 MED ORDER — EZETIMIBE 10 MG PO TABS
10.0000 mg | ORAL_TABLET | Freq: Every day | ORAL | 1 refills | Status: AC
  Filled 2024-04-30: qty 90, 90d supply, fill #0

## 2024-05-03 ENCOUNTER — Ambulatory Visit
Admission: RE | Admit: 2024-05-03 | Discharge: 2024-05-03 | Disposition: A | Discharge status: Home / Self Care | Source: Ambulatory Visit | Attending: Family Medicine | Admitting: Family Medicine

## 2024-05-03 ENCOUNTER — Other Ambulatory Visit (HOSPITAL_BASED_OUTPATIENT_CLINIC_OR_DEPARTMENT_OTHER): Payer: Self-pay

## 2024-05-03 VITALS — BP 118/73 | HR 105 | Temp 98.5°F | Resp 20

## 2024-05-03 DIAGNOSIS — J019 Acute sinusitis, unspecified: Secondary | ICD-10-CM

## 2024-05-03 MED ORDER — CETIRIZINE HCL 10 MG PO TABS
10.0000 mg | ORAL_TABLET | Freq: Every day | ORAL | 0 refills | Status: AC
  Filled 2024-05-03: qty 30, 30d supply, fill #0

## 2024-05-03 MED ORDER — PSEUDOEPHEDRINE HCL 30 MG PO TABS
30.0000 mg | ORAL_TABLET | Freq: Three times a day (TID) | ORAL | 0 refills | Status: AC | PRN
  Filled 2024-05-03: qty 24, 8d supply, fill #0

## 2024-05-03 MED ORDER — AMOXICILLIN 875 MG PO TABS
875.0000 mg | ORAL_TABLET | Freq: Two times a day (BID) | ORAL | 0 refills | Status: AC
  Filled 2024-05-03: qty 14, 7d supply, fill #0

## 2024-05-03 MED ORDER — PROMETHAZINE-DM 6.25-15 MG/5ML PO SYRP
5.0000 mL | ORAL_SOLUTION | Freq: Every evening | ORAL | 0 refills | Status: AC | PRN
  Filled 2024-05-03: qty 100, 20d supply, fill #0

## 2024-05-03 NOTE — ED Provider Notes (Signed)
 " [facility]  Note:  This document was prepared using Conservation officer, historic buildings and may include unintentional dictation errors.  MRN: [id number] DOB: Jul 17, 1965  Subjective:   Kerry Nelson is a 59 y.o. male presenting for 10-11 day history of persistent sinus congestion, sinus drainage, productive cough.  No chest pain, shortness of breath or wheezing.  No asthma.  No history of arrhythmias.  No smoking of any kind including cigarettes, cigars, vaping, marijuana use.    Current Outpatient Medications  Medication Instructions   Continuous Blood Gluc Receiver (DEXCOM G6 RECEIVER) DEVI Use as directed   Continuous Blood Gluc Transmit (DEXCOM G6 TRANSMITTER) MISC Use as directed   Continuous Glucose Sensor (DEXCOM G6 SENSOR) MISC Change sensor as directed every 10 days   Continuous Glucose Sensor (DEXCOM G6 SENSOR) MISC Use as directed to check blood sugar levels continuously, changing every 10 days.   Continuous Glucose Transmitter (DEXCOM G6 TRANSMITTER) MISC As directed   diclofenac  (VOLTAREN ) 50 MG EC tablet Take 1 tablet (50 mg total) by mouth 2 (two) times daily with food for shoulder pain.   esomeprazole  (NEXIUM ) 40 mg, Oral, Daily   ezetimibe  (ZETIA ) 10 mg, Oral, Daily   gabapentin  (NEURONTIN ) 100 MG capsule Take 1 capsule by mouth in the evening x 3 days, then 2 capsules in the evening x 3 days, then 3 capsules x 3 days, then 3 capsules twice daily   gabapentin  (NEURONTIN ) 300 mg, Oral, 2 times daily   gabapentin  (NEURONTIN ) 300 mg, Oral, 2 times daily   Jardiance  25 mg, Oral, Daily   Kerendia  10 mg, Oral, Daily   losartan -hydrochlorothiazide  (HYZAAR ) 100-25 MG tablet 1 tablet, Oral, Daily   metFORMIN  (GLUCOPHAGE ) 1000 MG tablet Take 1 tablet (1,000 mg total) by mouth 2 (two) times daily with meals   rosuvastatin  (CRESTOR ) 40 mg, Oral, Daily   rosuvastatin  (CRESTOR ) 40 mg, Oral, Daily   Trulicity  1.5 mg, Subcutaneous, Weekly   Trulicity  1.5 mg,  Subcutaneous, Weekly   Trulicity  3 mg, Subcutaneous, Weekly   Trulicity  4.5 mg, Subcutaneous, Weekly    Allergies[1]  Past Medical History:  Diagnosis Date   Chest pain    Diabetes mellitus without complication (HCC)    GERD (gastroesophageal reflux disease)    Hyperlipidemia    Hypertension    Prediabetes    Sleep apnea 2010   does not use Cpap     Past Surgical History:  Procedure Laterality Date   CARDIAC CATHETERIZATION  2010   Dr Court    CORRECTION HAMMER TOE Left    FOOT SURGERY     bone spur removal   HERNIA REPAIR     LIPOMA EXCISION     x2   SHOULDER ARTHROSCOPY WITH DISTAL CLAVICLE RESECTION Left 09/17/2012   Procedure: SHOULDER ARTHROSCOPY WITH DISTAL CLAVICLE RESECTION;  Surgeon: Maude LELON Right, MD;  Location: [facility] OR;  Service: Orthopedics;  Laterality: Left;  Left shoulder arthroscopy, subacromial decompression, distal clavicle resection.   UMBILICAL HERNIA REPAIR      Family History  Problem Relation Age of Onset   Asthma Mother    Depression Mother    Hypertension Brother    Seizures Brother     Social History   Occupational History    Comment: Management  Tobacco Use   Smoking status: Former    Current packs/day: 0.00    Average packs/day: 0.5 packs/day for 25.0 years (12.5 ttl pk-yrs)    Types: Cigarettes    Start date:  09/21/1988    Quit date: 09/21/2013    Years since quitting: 10.6   Smokeless tobacco: Never  Vaping Use   Vaping status: Never Used  Substance and Sexual Activity   Alcohol use: Not Currently    Comment: weekly   Drug use: No   Sexual activity: Not on file     ROS   Objective:   Vitals: BP 118/73 (BP Location: Left Arm)   Pulse (!) 105   Temp 98.5 F (36.9 C) (Oral)   Resp 20   SpO2 95%   Physical Exam Constitutional:      General: He is not in acute distress.    Appearance: Normal appearance. He is well-developed and normal weight. He is not ill-appearing, toxic-appearing or diaphoretic.  HENT:      Head: Normocephalic and atraumatic.     Right Ear: Tympanic membrane, ear canal and external ear normal. No drainage, swelling or tenderness. No middle ear effusion. There is no impacted cerumen. Tympanic membrane is not erythematous or bulging.     Left Ear: Tympanic membrane, ear canal and external ear normal. No drainage, swelling or tenderness.  No middle ear effusion. There is no impacted cerumen. Tympanic membrane is not erythematous or bulging.     Nose: Congestion present. No rhinorrhea.     Mouth/Throat:     Mouth: Mucous membranes are moist.     Pharynx: No oropharyngeal exudate or posterior oropharyngeal erythema.     Comments: Thick streaks of postnasal drainage overlying pharynx.  Eyes:     General: No scleral icterus.       Right eye: No discharge.        Left eye: No discharge.     Extraocular Movements: Extraocular movements intact.     Conjunctiva/sclera: Conjunctivae normal.  Cardiovascular:     Rate and Rhythm: Normal rate and regular rhythm.     Heart sounds: Normal heart sounds. No murmur heard.    No friction rub. No gallop.  Pulmonary:     Effort: Pulmonary effort is normal. No respiratory distress.     Breath sounds: Normal breath sounds. No stridor. No wheezing, rhonchi or rales.  Musculoskeletal:     Cervical back: Normal range of motion and neck supple. No rigidity. No muscular tenderness.  Neurological:     General: No focal deficit present.     Mental Status: He is alert and oriented to person, place, and time.  Psychiatric:        Mood and Affect: Mood normal.        Behavior: Behavior normal.        Thought Content: Thought content normal.     Assessment and Plan :   PDMP not reviewed this encounter.  1. Acute sinusitis, recurrence not specified, unspecified location      Deferred imaging given clear cardiopulmonary exam, hemodynamically stable vital signs. Will start empiric treatment for sinusitis with amoxicillin .  Recommended supportive care  otherwise. Counseled patient on potential for adverse effects with medications prescribed/recommended today, ER and return-to-clinic precautions discussed, patient verbalized understanding.     [1]  Allergies Allergen Reactions   Aleve [Naproxen] Palpitations     Christopher Savannah, PA-C 05/03/24 1218  "

## 2024-05-03 NOTE — ED Triage Notes (Signed)
 Pt c/o cold like sx started 12/23-c/o cont'd nasal congestion and cough that is now prod-NAD-steady gait

## 2024-05-03 NOTE — Discharge Instructions (Signed)
We will manage this as a sinus infection with amoxicillin. For sore throat or cough try using a honey-based tea. Use 3 teaspoons of honey with juice squeezed from half lemon. Place shaved pieces of ginger into 1/2-1 cup of water and warm over stove top. Then mix the ingredients and repeat every 4 hours as needed. Please take ibuprofen 600mg every 6 hours with food alternating with OR taken together with Tylenol 500mg-650mg every 6 hours for throat pain, fevers, aches and pains. Hydrate very well with at least 2 liters of water. Eat light meals such as soups (chicken and noodles, vegetable, chicken and wild rice).  Do not eat foods that you are allergic to.  Taking an antihistamine like Zyrtec can help against postnasal drainage, sinus congestion which can cause sinus pain, sinus headaches, throat pain, painful swallowing, coughing.  You can take this together with pseudoephedrine (Sudafed) at a dose of 30 mg 3 times a day or twice daily as needed for the same kind of nasal drip, congestion.  Use cough medication as needed.   

## 2024-05-12 ENCOUNTER — Other Ambulatory Visit (HOSPITAL_COMMUNITY): Payer: Self-pay

## 2024-05-14 ENCOUNTER — Other Ambulatory Visit (HOSPITAL_COMMUNITY): Payer: Self-pay

## 2024-05-14 MED ORDER — LANTUS SOLOSTAR 100 UNIT/ML ~~LOC~~ SOPN
10.0000 [IU] | PEN_INJECTOR | Freq: Every evening | SUBCUTANEOUS | 3 refills | Status: AC
  Filled 2024-05-14: qty 9, 90d supply, fill #0

## 2024-05-26 ENCOUNTER — Encounter: Admitting: Skilled Nursing Facility1

## 2024-06-03 ENCOUNTER — Other Ambulatory Visit (HOSPITAL_COMMUNITY): Payer: Self-pay

## 2024-06-03 ENCOUNTER — Other Ambulatory Visit: Payer: Self-pay

## 2024-06-03 MED ORDER — DEXCOM G6 SENSOR MISC
1.0000 | 2 refills | Status: AC
  Filled 2024-06-03 – 2024-06-05 (×2): qty 3, 30d supply, fill #0

## 2024-06-05 ENCOUNTER — Other Ambulatory Visit (HOSPITAL_COMMUNITY): Payer: Self-pay

## 2024-06-06 ENCOUNTER — Other Ambulatory Visit (HOSPITAL_COMMUNITY): Payer: Self-pay

## 2024-06-16 ENCOUNTER — Encounter: Admitting: Skilled Nursing Facility1
